# Patient Record
Sex: Female | Born: 1990 | Hispanic: No | Marital: Married | Smoking: Never smoker
Health system: Southern US, Community
[De-identification: ages and names within clinical notes are randomized; demographics above are authoritative.]

## PROBLEM LIST (undated history)

## (undated) ENCOUNTER — Inpatient Hospital Stay (HOSPITAL_COMMUNITY): Payer: Self-pay

## (undated) DIAGNOSIS — E119 Type 2 diabetes mellitus without complications: Secondary | ICD-10-CM

## (undated) HISTORY — PX: NO PAST SURGERIES: SHX2092

---

## 2014-01-28 ENCOUNTER — Encounter: Payer: Self-pay | Admitting: Nurse Practitioner

## 2014-03-25 ENCOUNTER — Encounter: Payer: Self-pay | Admitting: Nurse Practitioner

## 2014-03-25 ENCOUNTER — Ambulatory Visit (INDEPENDENT_AMBULATORY_CARE_PROVIDER_SITE_OTHER): Payer: Self-pay | Admitting: Nurse Practitioner

## 2014-03-25 VITALS — BP 113/79 | HR 96 | Temp 97.9°F | Ht 65.0 in | Wt 227.7 lb

## 2014-03-25 DIAGNOSIS — E669 Obesity, unspecified: Secondary | ICD-10-CM

## 2014-03-25 DIAGNOSIS — Z01419 Encounter for gynecological examination (general) (routine) without abnormal findings: Secondary | ICD-10-CM

## 2014-03-25 DIAGNOSIS — Z309 Encounter for contraceptive management, unspecified: Secondary | ICD-10-CM | POA: Insufficient documentation

## 2014-03-25 DIAGNOSIS — N926 Irregular menstruation, unspecified: Secondary | ICD-10-CM | POA: Insufficient documentation

## 2014-03-25 DIAGNOSIS — Z0000 Encounter for general adult medical examination without abnormal findings: Secondary | ICD-10-CM

## 2014-03-25 DIAGNOSIS — Z113 Encounter for screening for infections with a predominantly sexual mode of transmission: Secondary | ICD-10-CM

## 2014-03-25 MED ORDER — NORGESTIMATE-ETH ESTRADIOL 0.25-35 MG-MCG PO TABS: 1.0000 | ORAL_TABLET | Freq: Every day | ORAL | Status: DC

## 2014-03-25 NOTE — Progress Notes (Signed)
Pt. Here today for irregular cycles. States she has her period 2-3 times per month. Also c/o of mild pelvic pain.

## 2014-03-25 NOTE — Patient Instructions (Signed)
Contraception Choices Contraception (birth control) is the use of any methods or devices to prevent pregnancy. Below are some methods to help avoid pregnancy. HORMONAL METHODS   Contraceptive implant This is a thin, plastic tube containing progesterone hormone. It does not contain estrogen hormone. Your health care provider inserts the tube in the inner part of the upper arm. The tube can remain in place for up to 3 years. After 3 years, the implant must be removed. The implant prevents the ovaries from releasing an egg (ovulation), thickens the cervical mucus to prevent sperm from entering the uterus, and thins the lining of the inside of the uterus.  Progesterone-only injections These injections are given every 3 months by your health care provider to prevent pregnancy. This synthetic progesterone hormone stops the ovaries from releasing eggs. It also thickens cervical mucus and changes the uterine lining. This makes it harder for sperm to survive in the uterus.  Birth control pills These pills contain estrogen and progesterone hormone. They work by preventing the ovaries from releasing eggs (ovulation). They also cause the cervical mucus to thicken, preventing the sperm from entering the uterus. Birth control pills are prescribed by a health care provider.Birth control pills can also be used to treat heavy periods.  Minipill This type of birth control pill contains only the progesterone hormone. They are taken every day of each month and must be prescribed by your health care provider.  Birth control patch The patch contains hormones similar to those in birth control pills. It must be changed once a week and is prescribed by a health care provider.  Vaginal ring The ring contains hormones similar to those in birth control pills. It is left in the vagina for 3 weeks, removed for 1 week, and then a new one is put back in place. The patient must be comfortable inserting and removing the ring from the  vagina.A health care provider's prescription is necessary.  Emergency contraception Emergency contraceptives prevent pregnancy after unprotected sexual intercourse. This pill can be taken right after sex or up to 5 days after unprotected sex. It is most effective the sooner you take the pills after having sexual intercourse. Most emergency contraceptive pills are available without a prescription. Check with your pharmacist. Do not use emergency contraception as your only form of birth control. BARRIER METHODS   Female condom This is a thin sheath (latex or rubber) that is worn over the penis during sexual intercourse. It can be used with spermicide to increase effectiveness.  Female condom. This is a soft, loose-fitting sheath that is put into the vagina before sexual intercourse.  Diaphragm This is a soft, latex, dome-shaped barrier that must be fitted by a health care provider. It is inserted into the vagina, along with a spermicidal jelly. It is inserted before intercourse. The diaphragm should be left in the vagina for 6 to 8 hours after intercourse.  Cervical cap This is a round, soft, latex or plastic cup that fits over the cervix and must be fitted by a health care provider. The cap can be left in place for up to 48 hours after intercourse.  Sponge This is a soft, circular piece of polyurethane foam. The sponge has spermicide in it. It is inserted into the vagina after wetting it and before sexual intercourse.  Spermicides These are chemicals that kill or block sperm from entering the cervix and uterus. They come in the form of creams, jellies, suppositories, foam, or tablets. They do not require a   prescription. They are inserted into the vagina with an applicator before having sexual intercourse. The process must be repeated every time you have sexual intercourse. INTRAUTERINE CONTRACEPTION  Intrauterine device (IUD) This is a T-shaped device that is put in a woman's uterus during a  menstrual period to prevent pregnancy. There are 2 types:  Copper IUD This type of IUD is wrapped in copper wire and is placed inside the uterus. Copper makes the uterus and fallopian tubes produce a fluid that kills sperm. It can stay in place for 10 years.  Hormone IUD This type of IUD contains the hormone progestin (synthetic progesterone). The hormone thickens the cervical mucus and prevents sperm from entering the uterus, and it also thins the uterine lining to prevent implantation of a fertilized egg. The hormone can weaken or kill the sperm that get into the uterus. It can stay in place for 3 5 years, depending on which type of IUD is used. PERMANENT METHODS OF CONTRACEPTION  Female tubal ligation This is when the woman's fallopian tubes are surgically sealed, tied, or blocked to prevent the egg from traveling to the uterus.  Hysteroscopic sterilization This involves placing a small coil or insert into each fallopian tube. Your doctor uses a technique called hysteroscopy to do the procedure. The device causes scar tissue to form. This results in permanent blockage of the fallopian tubes, so the sperm cannot fertilize the egg. It takes about 3 months after the procedure for the tubes to become blocked. You must use another form of birth control for these 3 months.  Female sterilization This is when the female has the tubes that carry sperm tied off (vasectomy).This blocks sperm from entering the vagina during sexual intercourse. After the procedure, the man can still ejaculate fluid (semen). NATURAL PLANNING METHODS  Natural family planning This is not having sexual intercourse or using a barrier method (condom, diaphragm, cervical cap) on days the woman could become pregnant.  Calendar method This is keeping track of the length of each menstrual cycle and identifying when you are fertile.  Ovulation method This is avoiding sexual intercourse during ovulation.  Symptothermal method This is  avoiding sexual intercourse during ovulation, using a thermometer and ovulation symptoms.  Post ovulation method This is timing sexual intercourse after you have ovulated. Regardless of which type or method of contraception you choose, it is important that you use condoms to protect against the transmission of sexually transmitted infections (STIs). Talk with your health care provider about which form of contraception is most appropriate for you. Document Released: 11/15/2005 Document Revised: 07/18/2013 Document Reviewed: 05/10/2013 [employer] Patient Information 2014 [employer].  

## 2014-03-25 NOTE — Progress Notes (Signed)
History:  Kayla FontanaSayeda Smob is a 23 y.o. G1P1001 who [facility], irregular cycles and STD screening. She is currently sexually active with one partner and having 2 periods per month. She is not using any birth control and is agreeable to starting BCP's. Her LMP was 5 days ago.   The following portions of the patient's history were reviewed and updated as appropriate: allergies, current medications, past family history, past medical history, past social history, past surgical history and problem list.  Review of Systems:  Pertinent items are noted in HPI.  Objective:  Physical Exam BP 113/79  Pulse 96  Temp(Src) 97.9 F (36.6 C) (Oral)  Wt 227 lb 11.2 oz (103.284 kg)  LMP 03/18/2014 GENERAL: Well-developed, well-nourished female in no acute distress.  HEENT: Normocephalic, atraumatic.  NECK: Supple. Normal thyroid.  LUNGS: Normal rate. Clear to auscultation bilaterally.  HEART: Regular rate and rhythm with no adventitious sounds.  BREASTS: Symmetric in size. No masses, skin changes, nipple drainage, or lymphadenopathy. ABDOMEN: Soft, nontender, nondistended. No organomegaly. Normal bowel sounds appreciated in all quadrants.  PELVIC: Normal external female genitalia. Vagina is pink and rugated.  Normal discharge. Normal cervix contour. Pap smear obtained. Uterus is normal in size. No adnexal mass or tenderness.  EXTREMITIES: No cyanosis, clubbing, or edema, 2+ distal pulses.   Labs and Imaging No results found.  Assessment & Plan:  Assessment:  Well woman exam Irregular Menstrual cycles   Plans:  Pt will start Orthocyclen one po qday # 28/11 refills She will follow up in 2 months for reevaluation She will be notified of any abnormal labs  Delbert PhenixLinda M Buffey Zabinski, NP 03/25/2014 5:35 PM

## 2014-03-26 LAB — GC/CHLAMYDIA PROBE AMP
CT PROBE, AMP APTIMA: NEGATIVE
GC Probe RNA: NEGATIVE

## 2014-05-24 ENCOUNTER — Ambulatory Visit: Payer: Self-pay | Admitting: Nurse Practitioner

## 2014-05-24 ENCOUNTER — Telehealth: Payer: Self-pay | Admitting: *Deleted

## 2014-05-24 ENCOUNTER — Encounter: Payer: Self-pay | Admitting: *Deleted

## 2014-05-24 NOTE — Telephone Encounter (Signed)
Kayla George missed a scheduled appointment today for follow up . Called her with [employer] and left a message we are calling to notify you that you missed a scheduled appt. - call back to rescheduled . Also sent letter.

## 2014-09-30 ENCOUNTER — Encounter: Payer: Self-pay | Admitting: Nurse Practitioner

## 2014-10-15 ENCOUNTER — Inpatient Hospital Stay (HOSPITAL_COMMUNITY)
Admission: AD | Admit: 2014-10-15 | Discharge: 2014-10-15 | Disposition: A | Discharge status: Home / Self Care | Payer: Self-pay | Source: Ambulatory Visit | Attending: Obstetrics & Gynecology | Admitting: Obstetrics & Gynecology

## 2014-10-15 ENCOUNTER — Encounter (HOSPITAL_COMMUNITY): Payer: Self-pay | Admitting: *Deleted

## 2014-10-15 ENCOUNTER — Inpatient Hospital Stay (HOSPITAL_COMMUNITY): Payer: Self-pay

## 2014-10-15 DIAGNOSIS — N8300 Follicular cyst of ovary, unspecified side: Secondary | ICD-10-CM

## 2014-10-15 DIAGNOSIS — G8929 Other chronic pain: Secondary | ICD-10-CM

## 2014-10-15 DIAGNOSIS — R1031 Right lower quadrant pain: Secondary | ICD-10-CM | POA: Insufficient documentation

## 2014-10-15 DIAGNOSIS — R1032 Left lower quadrant pain: Secondary | ICD-10-CM | POA: Insufficient documentation

## 2014-10-15 LAB — URINALYSIS, ROUTINE W REFLEX MICROSCOPIC
Bilirubin Urine: NEGATIVE
GLUCOSE, UA: NEGATIVE mg/dL
KETONES UR: 15 mg/dL — AB
Nitrite: NEGATIVE
PROTEIN: NEGATIVE mg/dL
Specific Gravity, Urine: 1.02 (ref 1.005–1.030)
UROBILINOGEN UA: 1 mg/dL (ref 0.0–1.0)
pH: 6.5 (ref 5.0–8.0)

## 2014-10-15 LAB — URINE MICROSCOPIC-ADD ON

## 2014-10-15 LAB — CBC
HCT: 41 % (ref 36.0–46.0)
HEMOGLOBIN: 13.7 g/dL (ref 12.0–15.0)
MCH: 29 pg (ref 26.0–34.0)
MCHC: 33.4 g/dL (ref 30.0–36.0)
MCV: 86.7 fL (ref 78.0–100.0)
Platelets: 309 10*3/uL (ref 150–400)
RBC: 4.73 MIL/uL (ref 3.87–5.11)
RDW: 13.5 % (ref 11.5–15.5)
WBC: 8.2 10*3/uL (ref 4.0–10.5)

## 2014-10-15 LAB — POCT PREGNANCY, URINE: PREG TEST UR: NEGATIVE

## 2014-10-15 LAB — HIV ANTIBODY (ROUTINE TESTING W REFLEX): HIV 1&2 Ab, 4th Generation: NONREACTIVE

## 2014-10-15 MED ORDER — KETOROLAC TROMETHAMINE 10 MG PO TABS
10.0000 mg | ORAL_TABLET | Freq: Once | ORAL | Status: AC
  Administered 2014-10-15: 10 mg via ORAL
  Filled 2014-10-15: qty 1

## 2014-10-15 MED ORDER — KETOROLAC TROMETHAMINE 60 MG/2ML IM SOLN: 60.0000 mg | Freq: Once | INTRAMUSCULAR | Status: DC

## 2014-10-15 MED ORDER — NORGESTIMATE-ETH ESTRADIOL 0.25-35 MG-MCG PO TABS: 1.0000 | ORAL_TABLET | Freq: Every day | ORAL | Status: DC

## 2014-10-15 MED ORDER — IBUPROFEN 600 MG PO TABS: 600.0000 mg | ORAL_TABLET | Freq: Four times a day (QID) | ORAL | Status: DC | PRN

## 2014-10-15 NOTE — Discharge Instructions (Signed)
You have an ovarian follicle (a normal, functional cyst of the ovary) on the left.   Abdominal Pain, Women Abdominal (stomach, pelvic, or belly) pain can be caused by many things. It is important to tell your doctor:  The location of the pain.  Does it come and go or is it present all the time?  Are there things that start the pain (eating certain foods, exercise)?  Are there other symptoms associated with the pain (fever, nausea, vomiting, diarrhea)? All of this is helpful to know when trying to find the cause of the pain. CAUSES   Stomach: virus or bacteria infection, or ulcer.  Intestine: appendicitis (inflamed appendix), regional ileitis (Crohn's disease), ulcerative colitis (inflamed colon), irritable bowel syndrome, diverticulitis (inflamed diverticulum of the colon), or cancer of the stomach or intestine.  Gallbladder disease or stones in the gallbladder.  Kidney disease, kidney stones, or infection.  Pancreas infection or cancer.  Fibromyalgia (pain disorder).  Diseases of the female organs:  Uterus: fibroid (non-cancerous) tumors or infection.  Fallopian tubes: infection or tubal pregnancy.  Ovary: cysts or tumors.  Pelvic adhesions (scar tissue).  Endometriosis (uterus lining tissue growing in the pelvis and on the pelvic organs).  Pelvic congestion syndrome (female organs filling up with blood just before the menstrual period).  Pain with the menstrual period.  Pain with ovulation (producing an egg).  Pain with an IUD (intrauterine device, birth control) in the uterus.  Cancer of the female organs.  Functional pain (pain not caused by a disease, may improve without treatment).  Psychological pain.  Depression. DIAGNOSIS  Your doctor will decide the seriousness of your pain by doing an examination.  Blood tests.  X-rays.  Ultrasound.  CT scan (computed tomography, special type of X-ray).  MRI (magnetic resonance imaging).  Cultures, for  infection.  Barium enema (dye inserted in the large intestine, to better view it with X-rays).  Colonoscopy (looking in intestine with a lighted tube).  Laparoscopy (minor surgery, looking in abdomen with a lighted tube).  Major abdominal exploratory surgery (looking in abdomen with a large incision). TREATMENT  The treatment will depend on the cause of the pain.   Many cases can be observed and treated at home.  Over-the-counter medicines recommended by your caregiver.  Prescription medicine.  Antibiotics, for infection.  Birth control pills, for painful periods or for ovulation pain.  Hormone treatment, for endometriosis.  Nerve blocking injections.  Physical therapy.  Antidepressants.  Counseling with a psychologist or psychiatrist.  Minor or major surgery. HOME CARE INSTRUCTIONS   Do not take laxatives, unless directed by your caregiver.  Take over-the-counter pain medicine only if ordered by your caregiver. Do not take aspirin because it can cause an upset stomach or bleeding.  Try a clear liquid diet (broth or water) as ordered by your caregiver. Slowly move to a bland diet, as tolerated, if the pain is related to the stomach or intestine.  Have a thermometer and take your temperature several times a day, and record it.  Bed rest and sleep, if it helps the pain.  Avoid sexual intercourse, if it causes pain.  Avoid stressful situations.  Keep your follow-up appointments and tests, as your caregiver orders.  If the pain does not go away with medicine or surgery, you may try:  Acupuncture.  Relaxation exercises (yoga, meditation).  Group therapy.  Counseling. SEEK MEDICAL CARE IF:   You notice certain foods cause stomach pain.  Your home care treatment is not helping your pain.  You need stronger pain medicine.  You want your IUD removed.  You feel faint or lightheaded.  You develop nausea and vomiting.  You develop a rash.  You are  having side effects or an allergy to your medicine. SEEK IMMEDIATE MEDICAL CARE IF:   Your pain does not go away or gets worse.  You have a fever.  Your pain is felt only in portions of the abdomen. The right side could possibly be appendicitis. The left lower portion of the abdomen could be colitis or diverticulitis.  You are passing blood in your stools (bright red or black tarry stools, with or without vomiting).  You have blood in your urine.  You develop chills, with or without a fever.  You pass out. MAKE SURE YOU:   Understand these instructions.  Will watch your condition.  Will get help right away if you are not doing well or get worse. Document Released: 09/12/2007 Document Revised: 04/01/2014 Document Reviewed: 10/02/2009 [facility] Patient Information 2015 [employer] [place]. This information is not intended to replace advice given to you by your health care provider. Make sure you discuss any questions you have with your health care provider.  Ovarian Cyst An ovarian cyst is a fluid-filled sac that forms on an ovary. The ovaries are small organs that produce eggs in women. Various types of cysts can form on the ovaries. Most are not cancerous. Many do not cause problems, and they often go away on their own. Some may cause symptoms and require treatment. Common types of ovarian cysts include:  Functional cysts--These cysts may occur every month during the menstrual cycle. This is normal. The cysts usually go away with the next menstrual cycle if the woman does not get pregnant. Usually, there are no symptoms with a functional cyst.  Endometrioma cysts--These cysts form from the tissue that lines the uterus. They are also called "chocolate cysts" because they become filled with blood that turns brown. This type of cyst can cause pain in the lower abdomen during intercourse and with your menstrual period.  Cystadenoma cysts--This type develops from the cells on the outside of  the ovary. These cysts can get very big and cause lower abdomen pain and pain with intercourse. This type of cyst can twist on itself, cut off its blood supply, and cause severe pain. It can also easily rupture and cause a lot of pain.  Dermoid cysts--This type of cyst is sometimes found in both ovaries. These cysts may contain different kinds of body tissue, such as skin, teeth, hair, or cartilage. They usually do not cause symptoms unless they get very big.  Theca lutein cysts--These cysts occur when too much of a certain hormone (human chorionic gonadotropin) is produced and overstimulates the ovaries to produce an egg. This is most common after procedures used to assist with the conception of a baby (in vitro fertilization). CAUSES   Fertility drugs can cause a condition in which multiple large cysts are formed on the ovaries. This is called ovarian hyperstimulation syndrome.  A condition called polycystic ovary syndrome can cause hormonal imbalances that can lead to nonfunctional ovarian cysts. SIGNS AND SYMPTOMS  Many ovarian cysts do not cause symptoms. If symptoms are present, they may include:  Pelvic pain or pressure.  Pain in the lower abdomen.  Pain during sexual intercourse.  Increasing girth (swelling) of the abdomen.  Abnormal menstrual periods.  Increasing pain with menstrual periods.  Stopping having menstrual periods without being pregnant. DIAGNOSIS  These cysts are  commonly found during a routine or annual pelvic exam. Tests may be ordered to find out more about the cyst. These tests may include:  Ultrasound.  X-ray of the pelvis.  CT scan.  MRI.  Blood tests. TREATMENT  Many ovarian cysts go away on their own without treatment. Your health care provider may want to check your cyst regularly for 2-3 months to see if it changes. For women in menopause, it is particularly important to monitor a cyst closely because of the higher rate of ovarian cancer in  menopausal women. When treatment is needed, it may include any of the following:  A procedure to drain the cyst (aspiration). This may be done using a long needle and ultrasound. It can also be done through a laparoscopic procedure. This involves using a thin, lighted tube with a tiny camera on the end (laparoscope) inserted through a small incision.  Surgery to remove the whole cyst. This may be done using laparoscopic surgery or an open surgery involving a larger incision in the lower abdomen.  Hormone treatment or birth control pills. These methods are sometimes used to help dissolve a cyst. HOME CARE INSTRUCTIONS   Only take over-the-counter or prescription medicines as directed by your health care provider.  Follow up with your health care provider as directed.  Get regular pelvic exams and Pap tests. SEEK MEDICAL CARE IF:   Your periods are late, irregular, or painful, or they stop.  Your pelvic pain or abdominal pain does not go away.  Your abdomen becomes larger or swollen.  You have pressure on your bladder or trouble emptying your bladder completely.  You have pain during sexual intercourse.  You have feelings of fullness, pressure, or discomfort in your stomach.  You lose weight for no apparent reason.  You feel generally ill.  You become constipated.  You lose your appetite.  You develop acne.  You have an increase in body and facial hair.  You are gaining weight, without changing your exercise and eating habits.  You think you are pregnant. SEEK IMMEDIATE MEDICAL CARE IF:   You have increasing abdominal pain.  You feel sick to your stomach (nauseous), and you throw up (vomit).  You develop a fever that comes on suddenly.  You have abdominal pain during a bowel movement.  Your menstrual periods become heavier than usual. MAKE SURE YOU:  Understand these instructions.  Will watch your condition.  Will get help right away if you are not doing well  or get worse. Document Released: 11/15/2005 Document Revised: 11/20/2013 Document Reviewed: 07/23/2013 [facility] Patient Information 2015 [employer], [place]. This information is not intended to replace advice given to you by your health care provider. Make sure you discuss any questions you have with your health care provider.

## 2014-10-15 NOTE — MAU Provider Note (Signed)
Chief Complaint: Abdominal Pain  Chief Complaint: Abdominal Pain  First Provider Initiated Contact with Patient 10/15/14 1658    SUBJECTIVE HPI: Kayla George is a 23 y.o. 201P1001 female who presents with intermittent low abdominal pain x several months. Seen in [facility] outpatient clinic in April 2015 for irregular menstrual periods and low abdominal pain. Started on Sprintec which helped with both pain and cycle regulation, but ran out of birth control pills one month ago. Pain has worsened since then, 8/10 on pain scale today. His not tried anything for the pain.  Patient states she has been in a mutually monogamous relationship with her husband of 3 years. Tested for gonorrhea and Chlamydia 03/25/2014. Declines pelvic exam/repeat testing today.  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   History reviewed. No pertinent past medical history. OB History  Gravida Para Term Preterm AB SAB TAB Ectopic Multiple Living  1 1 1  0 0 0 0 0 0 1    # Outcome Date GA Lbr Len/2nd Weight Sex Delivery Anes PTL Lv  1 Term 11/10/11 [redacted]w[redacted]d   M Vag-Spont        History reviewed. No pertinent past surgical history. History   Social History  . Marital Status: Married    Spouse Name: N/A    Number of Children: N/A  . Years of Education: N/A   Occupational History  . Not on file.   Social History Main Topics  . Smoking status: Never Smoker   . Smokeless tobacco: Never Used  . Alcohol Use: No  . Drug Use: No  . Sexual Activity: Yes    Birth Control/ Protection: None   Other Topics Concern  . Not on file   Social History Narrative   No current facility-administered medications on file prior to encounter.   No current outpatient prescriptions on file prior to encounter.   Allergies  Allergen Reactions  . Eggs Or Egg-Derived Products Rash    ROS: Pertinent positive items in HPI. Neg for fever, chills, urinary complaints, flank pain, vaginal discharge, intermenstrual bleeding  OBJECTIVE Blood  pressure 120/83, pulse 95, temperature 98.4 F (36.9 C), temperature source Oral, resp. rate 16, height 5\' 7"  (1.702 m), weight 107.049 kg (236 lb), last menstrual period 10/06/2014. GENERAL: Well-developed, well-nourished, obese female in no acute distress.  HEENT: Normocephalic. Facial acne HEART: normal rate RESP: normal effort ABDOMEN: Soft, mild SP tenderness. Pos BS. Neg CVAT. EXTREMITIES: Nontender, no edema NEURO: Alert and oriented SPECULUM EXAM: Refused  LAB RESULTS Results for orders placed or performed during the hospital encounter of 10/15/14 (from the past 24 hour(s))  Urinalysis, Routine w reflex microscopic     Status: Abnormal   Collection Time: 10/15/14  4:00 PM  Result Value Ref Range   Color, Urine YELLOW YELLOW   APPearance HAZY (A) CLEAR   Specific Gravity, Urine 1.020 1.005 - 1.030   pH 6.5 5.0 - 8.0   Glucose, UA NEGATIVE NEGATIVE mg/dL   Hgb urine dipstick SMALL (A) NEGATIVE   Bilirubin Urine NEGATIVE NEGATIVE   Ketones, ur 15 (A) NEGATIVE mg/dL   Protein, ur NEGATIVE NEGATIVE mg/dL   Urobilinogen, UA 1.0 0.0 - 1.0 mg/dL   Nitrite NEGATIVE NEGATIVE   Leukocytes, UA TRACE (A) NEGATIVE  Urine microscopic-add on     Status: Abnormal   Collection Time: 10/15/14  4:00 PM  Result Value Ref Range   Squamous Epithelial / LPF MANY (A) RARE   WBC, UA 3-6 <3 WBC/hpf   RBC / HPF 3-6 <3 RBC/hpf  Bacteria, UA MANY (A) RARE  Pregnancy, urine POC     Status: None   Collection Time: 10/15/14  4:13 PM  Result Value Ref Range   Preg Test, Ur NEGATIVE NEGATIVE  CBC     Status: None   Collection Time: 10/15/14  4:53 PM  Result Value Ref Range   WBC 8.2 4.0 - 10.5 K/uL   RBC 4.73 3.87 - 5.11 MIL/uL   Hemoglobin 13.7 12.0 - 15.0 g/dL   HCT 16.141.0 09.636.0 - 04.546.0 %   MCV 86.7 78.0 - 100.0 fL   MCH 29.0 26.0 - 34.0 pg   MCHC 33.4 30.0 - 36.0 g/dL   RDW 40.913.5 81.111.5 - 91.415.5 %   Platelets 309 150 - 400 K/uL    IMAGING Koreas Transvaginal Non-ob  10/15/2014   CLINICAL DATA:   Chronic BILATERAL lower quadrant abdominal and pelvic pain off and on for 1 year, regular menses  EXAM: TRANSABDOMINAL AND TRANSVAGINAL ULTRASOUND OF PELVIS  TECHNIQUE: Both transabdominal and transvaginal ultrasound examinations of the pelvis were performed. Transabdominal technique was performed for global imaging of the pelvis including uterus, ovaries, adnexal regions, and pelvic cul-de-sac. It was necessary to proceed with endovaginal exam following the transabdominal exam to visualize the uterus, endometrium and ovaries.  COMPARISON:  None  FINDINGS: Uterus  Measurements: 7.5 x 4.2 x 4.4 cm. Normal morphology without mass.  Endometrium  Thickness: 6 mm thick, normal. No endometrial fluid or focal abnormality.  Right ovary  Measurements: 3.4 x 1.6 x 1.6 cm. Normal morphology without mass.  Left ovary  Measurements: 4.0 x 1.5 x 2.0 cm. Normal morphology without mass.  Other findings  No adnexal masses or free pelvic fluid.  IMPRESSION: Normal exam.   Electronically Signed   By: Ulyses SouthwardMark  Boles M.D.   On: 10/15/2014 18:17   [place] Pelvis Complete  10/15/2014   CLINICAL DATA:  Chronic BILATERAL lower quadrant abdominal and pelvic pain off and on for 1 year, regular menses  EXAM: TRANSABDOMINAL AND TRANSVAGINAL ULTRASOUND OF PELVIS  TECHNIQUE: Both transabdominal and transvaginal ultrasound examinations of the pelvis were performed. Transabdominal technique was performed for global imaging of the pelvis including uterus, ovaries, adnexal regions, and pelvic cul-de-sac. It was necessary to proceed with endovaginal exam following the transabdominal exam to visualize the uterus, endometrium and ovaries.  COMPARISON:  None  FINDINGS: Uterus  Measurements: 7.5 x 4.2 x 4.4 cm. Normal morphology without mass.  Endometrium  Thickness: 6 mm thick, normal. No endometrial fluid or focal abnormality.  Right ovary  Measurements: 3.4 x 1.6 x 1.6 cm. Normal morphology without mass.  Left ovary  Measurements: 4.0 x 1.5 x 2.0 cm.  Normal morphology without mass.  Other findings  No adnexal masses or free pelvic fluid.  IMPRESSION: Normal exam.   Electronically Signed   By: Ulyses SouthwardMark  Boles M.D.   On: 10/15/2014 18:17    MAU COURSE  ASSESSMENT 1. Follicular cyst of ovary   2. Abdominal pain, chronic, bilateral lower quadrant   Infertility  PLAN Discharge home in stable condition. Lengthy conversation w/ pt about normal US results, but presence of normal follicle. Also from menstrual Hx, infertility and exam, CNM suspects PCOS/anovulatory bleeding. Since OCPs helped in past, recommend restarting.  After appearing disinterested in discussion of abd pain, pt revealed that she is primarily concerned w/ three-year Hx of infertility. Has frequent intercourse. Has never been to specialist. Informed pt that ED is not the right place for Dx and management of infertility. Gave list  of providers.      Follow-up Information    Follow up with [facility].   Specialty:  Obstetrics and Gynecology   Why:  As needed if symptoms continue   Contact information:   [address] [place] [address] [phone]      Follow up with [facility] [place] [facility]ERN[facility] ADMISS[facility].   Why:  As needed in emergencies   Contact information:   [address] [id number] mc [place] [address] [phone]       Medication List    STOP taking these medications        norgestimate-ethinyl estradiol 0.25-35 MG-MCG tablet  Commonly known as:  ORTHO-CYCLEN,SPRINTEC,PREVIFEM      TAKE these medications        ibuprofen 600 MG tablet  Commonly known as:  ADVIL,MOTRIN  Take 1 tablet (600 mg total) by mouth every 6 (six) hours as needed.         [place], [place] 10/15/2014  6:33 PM

## 2014-10-15 NOTE — MAU Note (Signed)
Pain in lower abd, started a couple months ago, maybe a year. Pain comes and goes.

## 2014-10-16 LAB — URINE CULTURE: Special Requests: NORMAL

## 2015-03-12 ENCOUNTER — Ambulatory Visit: Payer: PRIVATE HEALTH INSURANCE | Attending: Internal Medicine | Admitting: Internal Medicine

## 2015-03-12 ENCOUNTER — Encounter: Payer: Self-pay | Admitting: Internal Medicine

## 2015-03-12 VITALS — BP 124/84 | HR 84 | Temp 98.0°F | Resp 16 | Ht 71.0 in | Wt 242.0 lb

## 2015-03-12 DIAGNOSIS — E669 Obesity, unspecified: Secondary | ICD-10-CM | POA: Insufficient documentation

## 2015-03-12 DIAGNOSIS — N979 Female infertility, unspecified: Secondary | ICD-10-CM | POA: Insufficient documentation

## 2015-03-12 DIAGNOSIS — E119 Type 2 diabetes mellitus without complications: Secondary | ICD-10-CM | POA: Insufficient documentation

## 2015-03-12 LAB — POCT GLYCOSYLATED HEMOGLOBIN (HGB A1C): HEMOGLOBIN A1C: 8.1

## 2015-03-12 LAB — GLUCOSE, POCT (MANUAL RESULT ENTRY): POC GLUCOSE: 148 mg/dL — AB (ref 70–99)

## 2015-03-12 MED ORDER — BLOOD GLUCOSE MONITOR KIT: PACK | Status: DC

## 2015-03-12 MED ORDER — GLUCOSE BLOOD VI STRP: ORAL_STRIP | Status: DC

## 2015-03-12 MED ORDER — GLUCOCOM LANCETS 28G MISC: Status: DC

## 2015-03-12 MED ORDER — METFORMIN HCL 500 MG PO TABS: 500.0000 mg | ORAL_TABLET | Freq: Two times a day (BID) | ORAL | Status: DC

## 2015-03-12 NOTE — Patient Instructions (Signed)
I have given you the name of [facility] phone number is [phone]. You may call them directly and give them your insurance info. Thanks

## 2015-03-12 NOTE — Progress Notes (Signed)
Pt is here to establish care. Pt is concerned that she has been trying to get pregnant and she wants to know why she is unable to get pregnant.

## 2015-03-12 NOTE — Progress Notes (Signed)
Patient ID: Kayla George, female   DOB: Jun 29, 1991, 24 y.o.   MRN: [id number]  HDQ:[id number]  [id number]:[id number]  DOB - 1991/09/29  CC:  Chief Complaint  Patient presents with  . Establish Care       HPI: Kayla George is a 24 y.o. female here today to establish medical care. She has no past medical history. She states that she currently has a 10 year old child and has been trying to get pregnant for the past two years. She denies any history of PCOS or menustral concerns. She notes normal periods that come around the same expected time each month. LMP was 02/16/15.  She reports "bad" cramps with menstrual cycles and some hirsutism. Patient reports that she was told that she had elevated blood sugar and would like to be tested for diabetes. She denies polyuria, polydipsia, blurred vision, neuropathy, headaches, or weight loss.    Allergies  Allergen Reactions  . Eggs Or Egg-Derived Products Rash   History reviewed. No pertinent past medical history. Current Outpatient Prescriptions on File Prior to Visit  Medication Sig Dispense Refill  . ibuprofen (ADVIL,MOTRIN) 600 MG tablet Take 1 tablet (600 mg total) by mouth every 6 (six) hours as needed. (Patient not taking: Reported on 03/12/2015) 30 tablet 1   No current facility-administered medications on file prior to visit.   History reviewed. No pertinent family history. History   Social History  . Marital Status: Married    Spouse Name: N/A  . Number of Children: N/A  . Years of Education: N/A   Occupational History  . Not on file.   Social History Main Topics  . Smoking status: Never Smoker   . Smokeless tobacco: Never Used  . Alcohol Use: No  . Drug Use: No  . Sexual Activity: Yes    Birth Control/ Protection: None   Other Topics Concern  . Not on file   Social History Narrative    Review of Systems: See HPI     Objective:   Filed Vitals:   03/12/15 1145  BP: 124/84  Pulse: 84  Temp: 98 F (36.7 C)  Resp: 16     Physical Exam  Constitutional: She is oriented to person, place, and time.  Cardiovascular: Normal rate, regular rhythm and normal heart sounds.   Pulmonary/Chest: Effort normal and breath sounds normal.  Abdominal: Soft. Bowel sounds are normal.  Neurological: She is alert and oriented to person, place, and time.  Skin: Skin is warm and dry.  Psychiatric: She has a normal mood and affect.     Lab Results  Component Value Date   WBC 8.2 10/15/2014   HGB 13.7 10/15/2014   HCT 41.0 10/15/2014   MCV 86.7 10/15/2014   PLT 309 10/15/2014   No results found for: CREATININE, BUN, NA, K, CL, CO2  No results found for: HGBA1C Lipid Panel  No results found for: CHOL, TRIG, HDL, CHOLHDL, VLDL, LDLCALC     Assessment and plan:   Kayla George was seen today for establish care.  Diagnoses and all orders for this visit:  Infertility, female Orders: -     Ambulatory referral to Gynecology---r/o PCOS. She is a new onset diabetic discovered today. She reports some hirsutism Patient has [employer], I have advised her to call [facility].   Type 2 diabetes mellitus without complication Orders: -     metFORMIN (GLUCOPHAGE) 500 MG tablet; Take 1 tablet (500 mg total) by mouth 2 (two) times daily with a meal. -  Amb Referral to Nutrition and Diabetic E -     blood glucose meter kit and supplies KIT; Check blood sugar TID & QHS. (FOR ICD-9 250.00, 250.01). -     glucose blood (CHOICE DM FORA G20 TEST STRIPS) test strip; Use as instructed -     GlucoCom Lancets MISC; Check blood sugar TID & QHS Patient is a new onset diabetic and was educated for at least 50% of the visit on diabetes and diet, routine maintenance, foot care, and exercise.  Patient will begin Metformin daily and explained common side effects. Stressed medication compliance and weight loss. Patient given materials for managing diabetes. Will follow up in three months with repeat  A1C.  Obesity Orders: -     POCT glucose (manual entry) -     POCT glycosylated hemoglobin (Hb A1C) Weight loss discussed at length and its complications to health.  Patient will loss 10 months by next visit in 3 months.  Diet and exercise discussed as well as calorie intake. Stressed weight loss relating to new diagnoses of DM and infertility.     Return in about 3 months (around 06/11/2015) for Diabetes Mellitus.    Chari Manning, NP-C [facility] [phone] 03/12/2015, 12:22 PM

## 2015-03-16 ENCOUNTER — Encounter: Payer: Self-pay | Admitting: Internal Medicine

## 2015-05-26 ENCOUNTER — Ambulatory Visit: Payer: PRIVATE HEALTH INSURANCE | Admitting: Internal Medicine

## 2015-08-14 ENCOUNTER — Ambulatory Visit (INDEPENDENT_AMBULATORY_CARE_PROVIDER_SITE_OTHER): Payer: PRIVATE HEALTH INSURANCE | Admitting: Medical

## 2015-08-14 ENCOUNTER — Encounter: Payer: Self-pay | Admitting: Medical

## 2015-08-14 VITALS — BP 121/84 | HR 97 | Temp 98.5°F | Wt 235.4 lb

## 2015-08-14 DIAGNOSIS — Z124 Encounter for screening for malignant neoplasm of cervix: Secondary | ICD-10-CM

## 2015-08-14 DIAGNOSIS — Z01419 Encounter for gynecological examination (general) (routine) without abnormal findings: Secondary | ICD-10-CM

## 2015-08-14 DIAGNOSIS — R102 Pelvic and perineal pain: Secondary | ICD-10-CM | POA: Diagnosis not present

## 2015-08-14 DIAGNOSIS — Z113 Encounter for screening for infections with a predominantly sexual mode of transmission: Secondary | ICD-10-CM | POA: Diagnosis not present

## 2015-08-14 DIAGNOSIS — Z1151 Encounter for screening for human papillomavirus (HPV): Secondary | ICD-10-CM | POA: Diagnosis not present

## 2015-08-14 NOTE — Patient Instructions (Signed)
Pelvic Pain Pelvic pain is pain felt below the belly button and between your hips. It can be caused by many different things. It is important to get help right away. This is especially true for severe, sharp, or unusual pain that comes on suddenly.  HOME CARE  Only take medicine as told by your doctor.  Rest as told by your doctor.  Eat a healthy diet, such as fruits, vegetables, and lean meats.  Drink enough fluids to keep your pee (urine) clear or pale yellow, or as told.  Avoid sex (intercourse) if it causes pain.  Apply warm or cold packs to your lower belly (abdomen). Use the type of pack that helps the pain.  Avoid situations that cause you stress.  Keep a journal to track your pain. Write down:  When the pain started.  Where it is located.  If there are things that seem to be related to the pain, such as food or your period.  Follow up with your doctor as told. GET HELP RIGHT AWAY IF:   You have heavy bleeding from the vagina.  You have more pelvic pain.  You feel lightheaded or pass out (faint).  You have chills.  You have pain when you pee or have blood in your pee.  You cannot stop having watery poop (diarrhea).  You cannot stop throwing up (vomiting).  You have a fever or lasting symptoms for more than 3 days.  You have a fever and your symptoms suddenly get worse.  You are being physically or sexually abused.  Your medicine does not help your pain.  You have fluid (discharge) coming from your vagina that is not normal. MAKE SURE YOU:  Understand these instructions.  Will watch your condition.  Will get help if you are not doing well or get worse. Document Released: 05/03/2008 Document Revised: 05/16/2012 Document Reviewed: 03/06/2012 ExitCare Patient Information 2015 [employer]. This information is not intended to replace advice given to you by your health care provider. Make sure you discuss any questions you have with your health care  provider.  

## 2015-08-14 NOTE — Progress Notes (Signed)
Patient ID: Kayla George, female   DOB: 1990-12-04, 24 y.o.   MRN: [id number] Subjective:    Kayla George is a 24 y.o. female who presents for an annual exam. The patient has no complaints today. She states that has a regular monthly cycle. She denies use of birth control. She desires pregnancy. The patient is sexually active. GYN screening history: last pap: approximate date 02/2014 and was normal. The patient wears seatbelts: yes. The patient participates in regular exercise: no. Has the patient ever been transfused or tattooed?: no. The patient reports that there is not domestic violence in her life.   Menstrual History: OB History    Gravida Para Term Preterm AB TAB SAB Ectopic Multiple Living   0 0 0 0 0 0 1      Menarche age: 24 years old  Patient's last menstrual period was 07/27/2015 (exact date).    The following portions of the patient's history were reviewed and updated as appropriate: allergies, current medications, past family history, past medical history, past social history, past surgical history and problem list.  Review of Systems Pertinent items are noted in HPI.    Objective:     BP 121/84 mmHg  Pulse 97  Temp(Src) 98.5 F (36.9 C)  Wt 235 lb 6.4 oz (106.777 kg)  LMP 07/27/2015 (Exact Date) CONSTITUTIONAL: Well-developed, well-nourished female in no acute distress.  HENT:  Normocephalic, atraumatic, External right and left ear normal. Oropharynx is clear and moist EYES: Conjunctivae and EOM are normal. Pupils are equal, round, and reactive to light. No scleral icterus.  NECK: Normal range of motion, supple, no masses.   SKIN: Skin is warm and dry. No rash noted. Not diaphoretic. No erythema. No pallor. NEUROLGIC: Alert and oriented to person, place, and time. Normal reflexes, muscle tone coordination. No cranial nerve deficit noted. PSYCHIATRIC: Normal mood and affect. Normal behavior. Normal judgment and thought content. CARDIOVASCULAR: Normal heart rate noted,  regular rhythm RESPIRATORY: Clear to auscultation bilaterally. Effort and breath sounds normal, no problems with respiration noted. BREASTS: Patient declines ABDOMEN: Soft, normal bowel sounds, no distention noted. Mild bilateral lower abdominal tenderness to palpation without rebound or guarding.  PELVIC: Normal appearing external genitalia; normal appearing vaginal mucosa and cervix.  Normal appearing discharge.  Pap smear obtained.  Normal uterine size, no other palpable masses, no uterine or adnexal tenderness. MUSCULOSKELETAL: Normal range of motion. No tenderness.  No cyanosis, clubbing, or edema.    .     Assessment:    Healthy female exam.   Pelvic pain in female, more likely GI related  Plan:     Pap smear obtained. patient will be called with abnormal results   GC/Chlamydia off the pap smear Pelvic and transvaginal US ordered to evaluated pelvic pain Patient to return to [facility] in 1 year for annual exam or sooner if [place] warrants further work-up or as needed  Marny Lowenstein, PA-C 08/14/2015 3:07 PM

## 2015-08-18 LAB — CYTOLOGY - PAP

## 2015-08-22 ENCOUNTER — Ambulatory Visit (HOSPITAL_COMMUNITY)
Admission: RE | Admit: 2015-08-22 | Discharge: 2015-08-22 | Disposition: A | Discharge status: Home / Self Care | Payer: PRIVATE HEALTH INSURANCE | Source: Ambulatory Visit | Attending: Medical | Admitting: Medical

## 2015-08-22 DIAGNOSIS — R102 Pelvic and perineal pain: Secondary | ICD-10-CM | POA: Diagnosis present

## 2015-08-26 ENCOUNTER — Telehealth: Payer: Self-pay | Admitting: General Practice

## 2015-08-26 NOTE — Telephone Encounter (Signed)
Per Raynelle Fanning, patient's [place] and lab tests are normal and do NOT indicate a GYN reason for her abdominal pain. She will need to follow-up with a PCP for further evaluation if the pain continues. Patient can come back in a year for annual exam. Called patient with [employer] [phone], no answer and unable to leave message

## 2015-09-01 ENCOUNTER — Encounter: Payer: Self-pay | Admitting: *Deleted

## 2015-09-01 NOTE — Telephone Encounter (Signed)
[employer] ID# [phone], Attempted to contact patient, no answer, unable to leave a message.  Will send a letter.  Letter sent.

## 2016-02-18 ENCOUNTER — Encounter: Payer: Self-pay | Admitting: *Deleted

## 2016-02-18 DIAGNOSIS — Z139 Encounter for screening, unspecified: Secondary | ICD-10-CM

## 2016-02-18 NOTE — Congregational Nurse Program (Signed)
Congregational Nurse Program Note  Date of Encounter: 02/18/2016  Past Medical History: No past medical history on file.  Encounter Details:     CNP Questionnaire - 02/17/16 1230    Patient Demographics   Is this a new or existing patient? New   Patient is considered a/an [place]   Race Other   Patient Assistance   Location of Patient Assistance [place]   Patient's financial/insurance status Medicaid   Uninsured Patient No   Patient referred to apply for the following financial assistance Not Applicable   Food insecurities addressed Not Applicable   Transportation assistance No   Assistance securing medications No   Educational health offerings Navigating the healthcare system   Encounter Details   Primary purpose of visit Navigating the Healthcare System   Was an Emergency Department visit averted? Not Applicable   Does patient have a medical provider? Yes   Patient referred to Area Agency   Was a mental health screening completed? (GAINS tool) No   Does patient have dental issues? No   Does patient have vision issues? No   Since previous encounter, have you referred patient for abnormal blood pressure that resulted in a new diagnosis or medication change? No   Since previous encounter, have you referred patient for abnormal blood glucose that resulted in a new diagnosis or medication change? No       Client came for [facility] to be [facility]. Appointment made for April 21 at 0930 .Marland Kitchen. Information was given with understanding of time of visit and what she needed to bring

## 2016-03-19 ENCOUNTER — Encounter: Payer: Self-pay | Admitting: Obstetrics & Gynecology

## 2016-04-09 ENCOUNTER — Ambulatory Visit: Payer: PRIVATE HEALTH INSURANCE | Admitting: Obstetrics & Gynecology

## 2016-04-09 ENCOUNTER — Encounter: Payer: Self-pay | Admitting: Obstetrics & Gynecology

## 2016-04-09 ENCOUNTER — Ambulatory Visit (INDEPENDENT_AMBULATORY_CARE_PROVIDER_SITE_OTHER): Payer: PRIVATE HEALTH INSURANCE | Admitting: Obstetrics & Gynecology

## 2016-04-09 DIAGNOSIS — N978 Female infertility of other origin: Secondary | ICD-10-CM

## 2016-04-09 DIAGNOSIS — Z711 Person with feared health complaint in whom no diagnosis is made: Secondary | ICD-10-CM

## 2016-04-09 NOTE — Progress Notes (Signed)
Patient ID: Kayla George, female   DOB: 11/09/1991, 25 y.o.   MRN: [id number] History:  25 y.o. G1P1001 here today for eval of inability to conceive. Pt with no other problems.  She reports reg monthly cycles.  Her last chil was born in 2012. Pt was on pills to 'reactivate the uterus.'  The following portions of the patient's history were reviewed and updated as appropriate: allergies, current medications, past family history, past medical history, past social history, past surgical history and problem list.  Review of Systems:  Pertinent items are noted in HPI.  Objective:  Physical Exam There were no vitals taken for this visit. Gen: NAD   Labs and Imaging No results found.  Assessment & Plan:  Secondary infertility.  Reviewed most likely times to conceive Given info for Dr. Stann OreYalcinkaya  Kayla George, M.D., FACOG   Of note: Pt has another chart which was created for this visit based on the name given by pt.  She was schedule today for a annual but,. Had an annual with normal PAP 07/2015  Kayla George, M.D., Evern [facility]

## 2016-04-09 NOTE — Progress Notes (Deleted)
Patient ID: Kayla George, female   DOB: 04/30/1991, 25 y.o.   MRN: [id number] Subjective:     Kayla George is a 25 y.o. female here for a routine exam.  G1P1. del via SVD 2012. Current complaints: unable to get pregnant.  Pt was on pills to 'reactivate the uterus'   Gynecologic History Patient's last menstrual period was 04/03/2016. Contraception: none Last Pap: ***. Results were: {norm/abn:16337} Last mammogram: ***. Results were: {norm/abn:16337}  Obstetric History OB History  No data available     {Common ambulatory SmartLinks:19316}  Review of Systems {ros; complete:30496}    Objective:    {exam; complete:18323}    Assessment:    Healthy female exam.    Plan:    {plan:19193}

## 2016-04-10 NOTE — Progress Notes (Signed)
Patient ID: Margaretha SheffieldSayeda Ifhag, female   DOB: 02/13/1991, 25 y.o.   MRN: [id number] Pt has another chart under another name

## 2016-04-14 ENCOUNTER — Encounter: Payer: Self-pay | Admitting: Obstetrics & Gynecology

## 2016-05-18 ENCOUNTER — Other Ambulatory Visit: Payer: Self-pay | Admitting: Specialist

## 2016-05-18 DIAGNOSIS — R102 Pelvic and perineal pain: Secondary | ICD-10-CM

## 2016-05-26 ENCOUNTER — Ambulatory Visit
Admission: RE | Admit: 2016-05-26 | Discharge: 2016-05-26 | Disposition: A | Discharge status: Home / Self Care | Payer: No Typology Code available for payment source | Source: Ambulatory Visit | Attending: Specialist | Admitting: Specialist

## 2016-05-26 DIAGNOSIS — R102 Pelvic and perineal pain: Secondary | ICD-10-CM

## 2016-06-30 IMAGING — US US TRANSVAGINAL NON-OB
1 series · 13 of 25 positions shown · non-contrast
Comparison: 08/22/2015.

CLINICAL DATA: Intermittent pelvic pain.

EXAM:
TRANSABDOMINAL AND TRANSVAGINAL ULTRASOUND OF PELVIS
TECHNIQUE: Both transabdominal and transvaginal ultrasound examinations of the
pelvis were performed. Transabdominal technique was performed for
global imaging of the pelvis including uterus, ovaries, adnexal
regions, and pelvic cul-de-sac. It was necessary to proceed with
endovaginal exam following the transabdominal exam to visualize the
uterus and ovaries.

[Series 1: us transvaginal non-ob · 0.26mm/px · 13 of 71 slices shown]
[im 1/71]
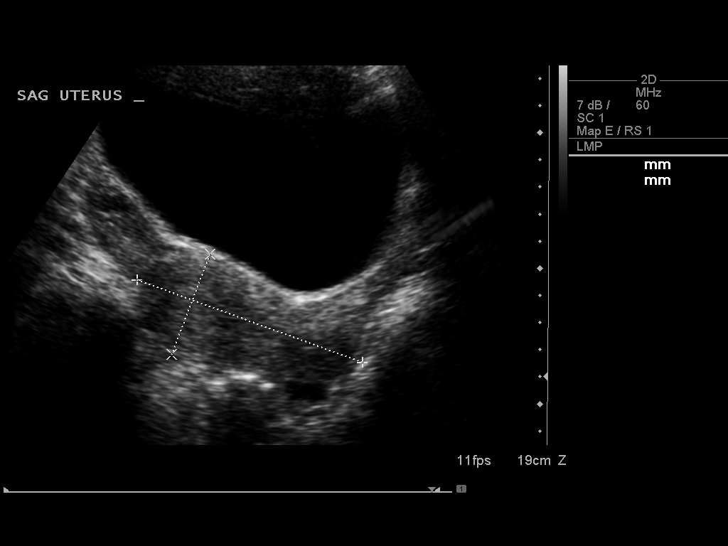
[im 6/71]
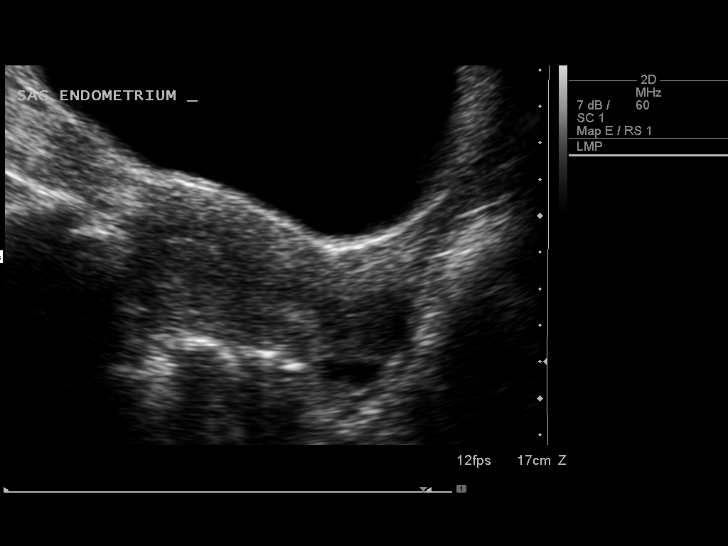
[im 12/71]
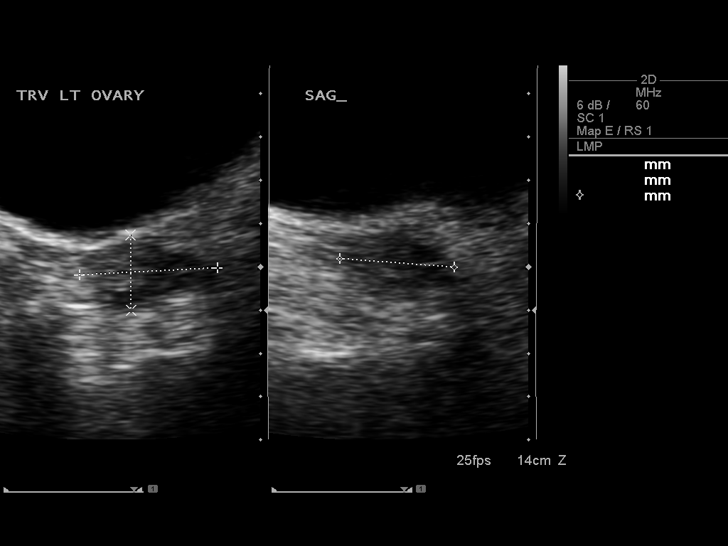
[im 18/71]
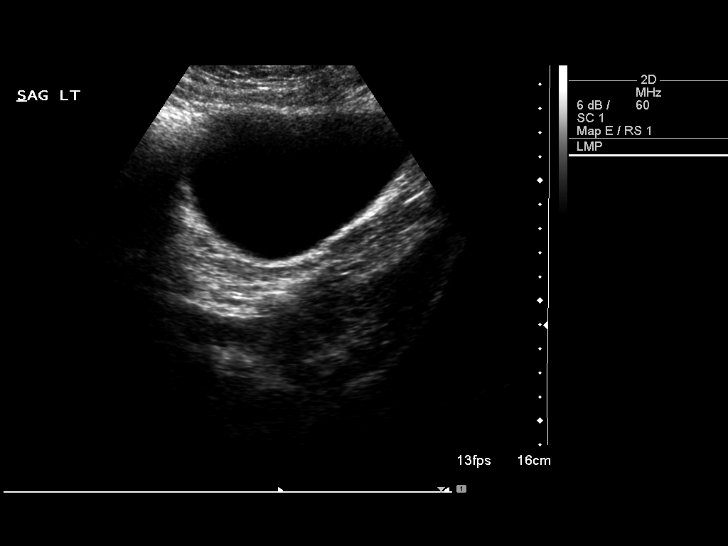
[im 24/71]
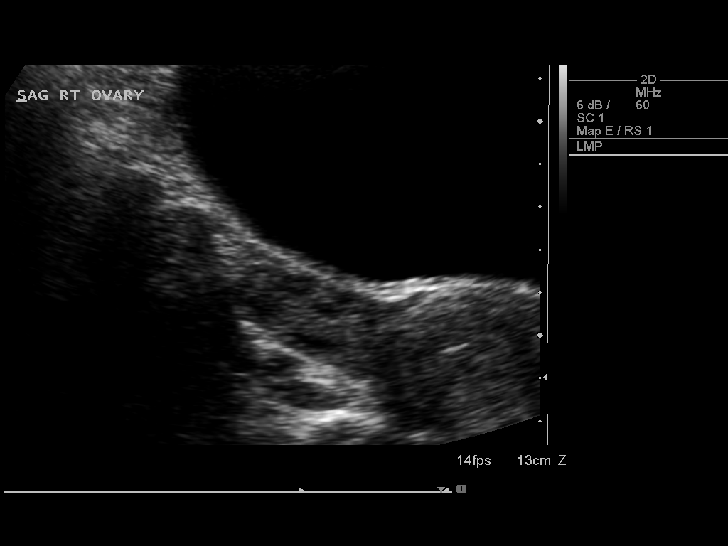
[im 30/71]
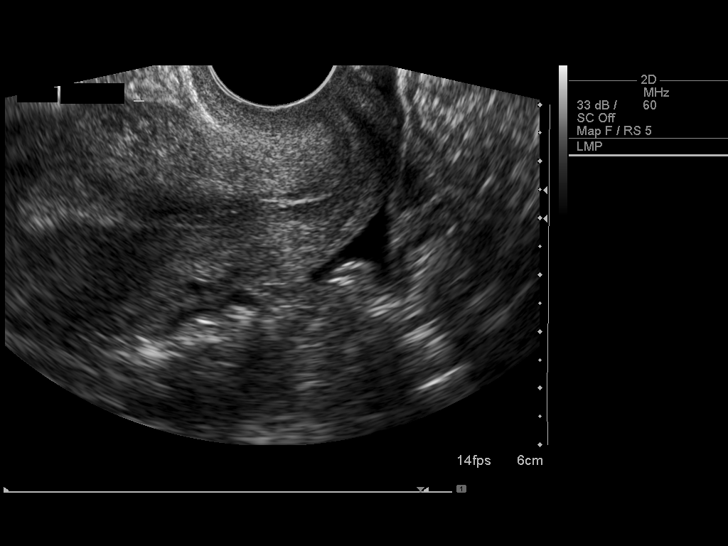
[im 36/71]
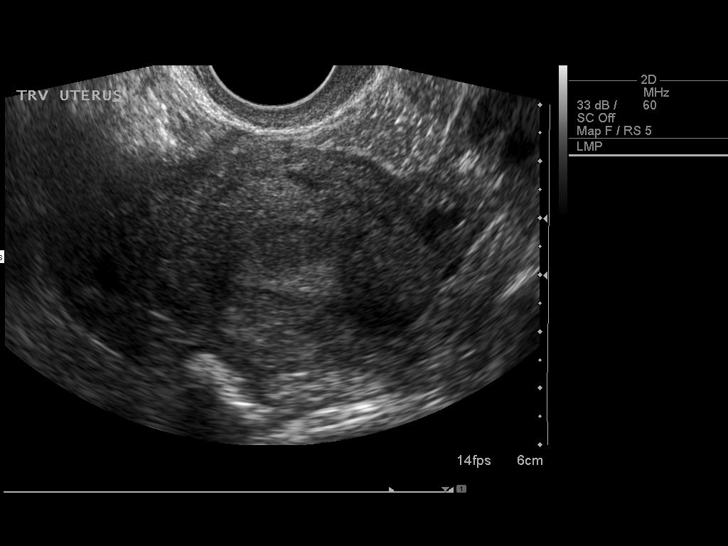
[im 41/71]
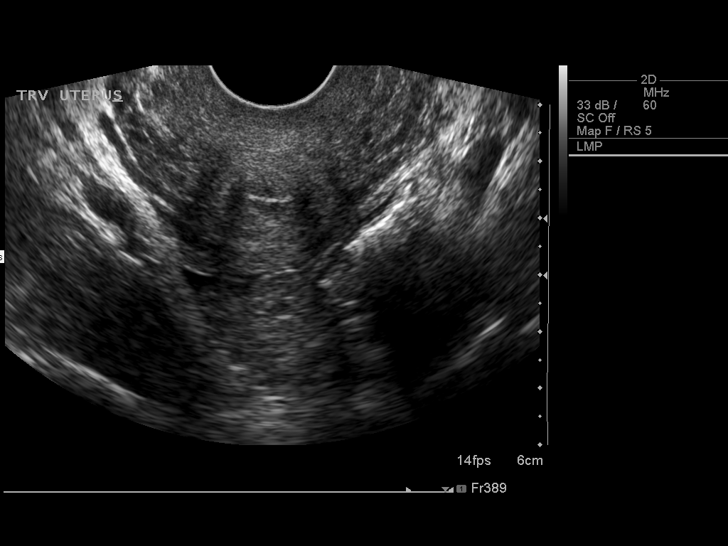
[im 47/71]
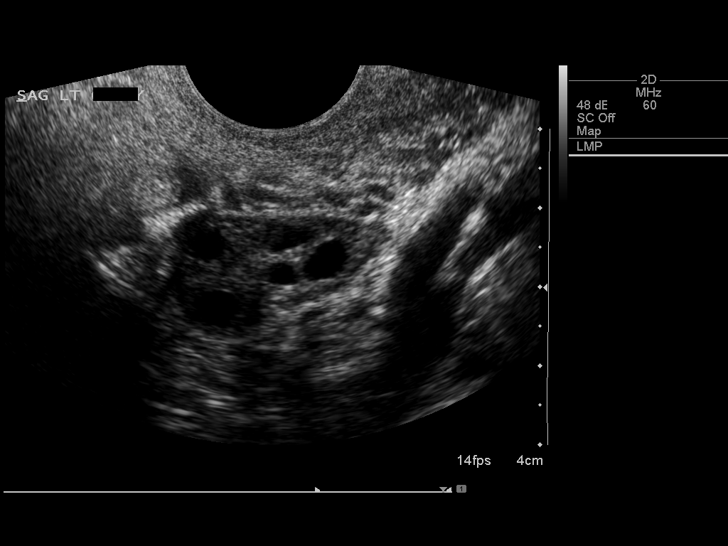
[im 53/71]
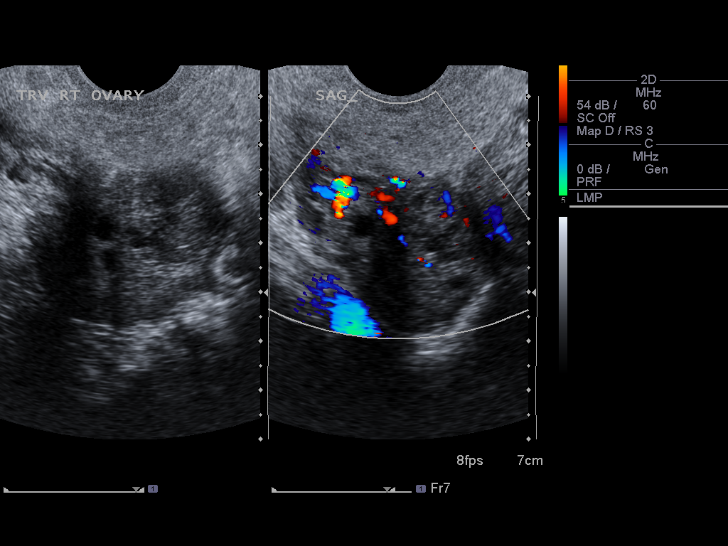
[im 59/71]
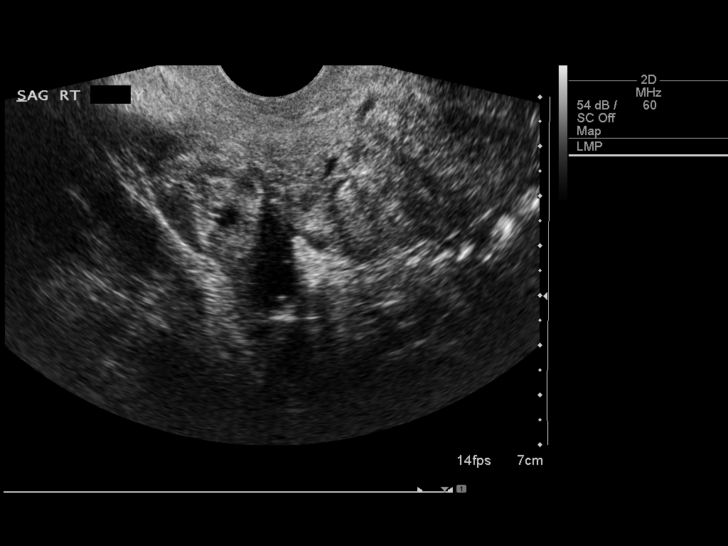
[im 65/71]
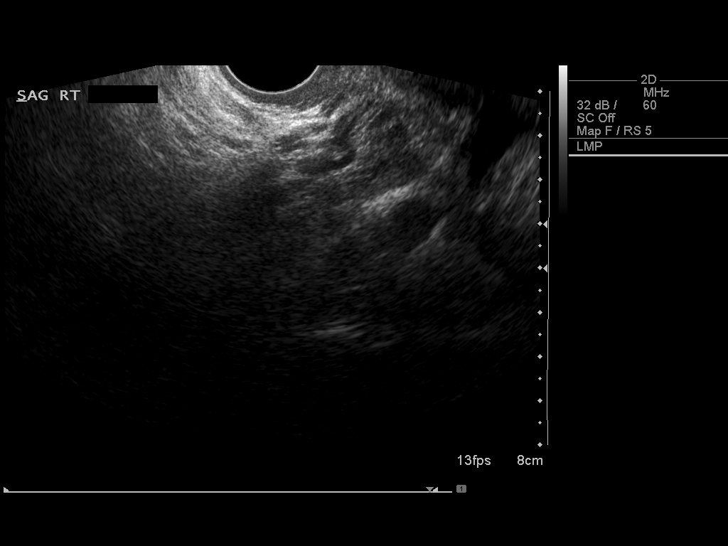
[im 71/71]
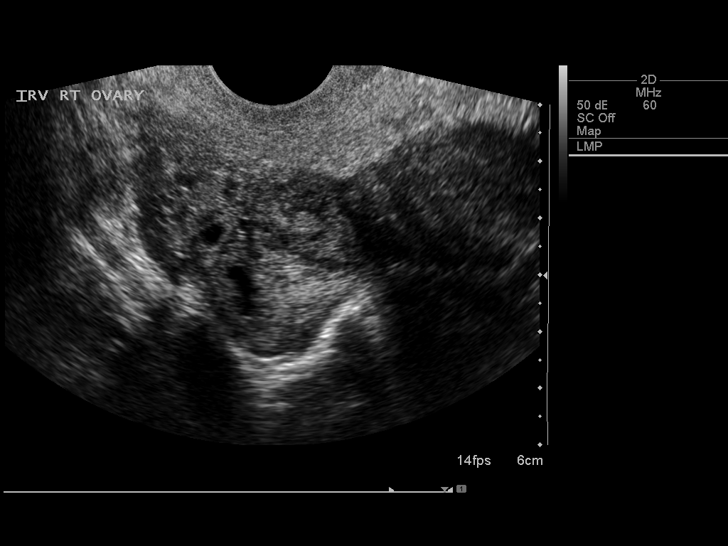

[13 of 25 positions shown; findings below may reference images not displayed]

FINDINGS: Uterus

Measurements: 8.0 x 4.3 x 5.6 cm. No focal abnormality identified.

Endometrium

Thickness: 10.5 mm.  No focal abnormality visualized.

Right ovary

Measurements: 4.4 x 3.2 x 4.0 cm. 1.8 x 1.6 x 1.5 cm inhomogeneous
area noted in the right ovary. A focal ovarian lesion cannot be
completely excluded. Follow-up pelvic ultrasound suggested to
demonstrate resolution. Pregnancy test is suggested to exclude
pregnancy/ectopic pregnancy.

Left ovary

Measurements: 4.3 x 1.5 x 2.2 cm. Normal appearance/no adnexal mass.

Other findings

Trace free pelvic fluid .
IMPRESSION: 1. 1.8 x 1.6 x 1.5 cm inhomogeneous area noted in the right ovary. A
focal ovarian lesion cannot be excluded. Follow-up pelvic ultrasound
suggested to demonstrate resolution. Pregnancy test is suggested to
exclude pregnancy/ectopic pregnancy. Short-interval follow up
ultrasound in 6-12 weeks is recommended, preferably during the week
following the patient's normal menses.

2.  Trace free pelvic fluid.

## 2016-09-30 ENCOUNTER — Encounter (HOSPITAL_COMMUNITY): Payer: Self-pay | Admitting: Emergency Medicine

## 2016-09-30 ENCOUNTER — Ambulatory Visit (HOSPITAL_COMMUNITY)
Admission: EM | Admit: 2016-09-30 | Discharge: 2016-09-30 | Disposition: A | Discharge status: Home / Self Care | Payer: No Typology Code available for payment source | Attending: Emergency Medicine | Admitting: Emergency Medicine

## 2016-09-30 DIAGNOSIS — A084 Viral intestinal infection, unspecified: Secondary | ICD-10-CM | POA: Diagnosis not present

## 2016-09-30 DIAGNOSIS — Z7984 Long term (current) use of oral hypoglycemic drugs: Secondary | ICD-10-CM | POA: Diagnosis not present

## 2016-09-30 DIAGNOSIS — N3000 Acute cystitis without hematuria: Secondary | ICD-10-CM

## 2016-09-30 LAB — POCT URINALYSIS DIP (DEVICE)
BILIRUBIN URINE: NEGATIVE
GLUCOSE, UA: NEGATIVE mg/dL
Ketones, ur: NEGATIVE mg/dL
Nitrite: NEGATIVE
Protein, ur: NEGATIVE mg/dL
SPECIFIC GRAVITY, URINE: 1.025 (ref 1.005–1.030)
Urobilinogen, UA: 0.2 mg/dL (ref 0.0–1.0)
pH: 5.5 (ref 5.0–8.0)

## 2016-09-30 LAB — POCT PREGNANCY, URINE: PREG TEST UR: NEGATIVE

## 2016-09-30 MED ORDER — CEPHALEXIN 500 MG PO CAPS: 500.0000 mg | ORAL_CAPSULE | Freq: Three times a day (TID) | ORAL | 0 refills | Status: DC

## 2016-09-30 NOTE — ED Triage Notes (Signed)
Prefers Arabic  Here for abd pain onset 3 days associated w/diarrhea and dysuria  Denies fevers  LMP = 10/25  A&O x4... NAD

## 2016-09-30 NOTE — Discharge Instructions (Signed)
You have a urinary tract infection. Take Keflex 3 times a day for 3 days.  The diarrhea is caused by a virus. Make sure you are drinking plenty of fluids. You can get Imodium at the drug store and take it once a day to slow down the diarrhea. The diarrhea will likely last another 1-2 weeks.  Follow-up as needed.

## 2016-09-30 NOTE — ED Provider Notes (Signed)
[place]    CSN: [id number] Arrival date & time: 09/30/16  1544     History   Chief Complaint Chief Complaint  Patient presents with  . Abdominal Pain    HPI Kayla George is a 25 y.o. female.   HPI  She is a 25 year old woman here for evaluation of diarrhea. An Arabic interpreter was used for this encounter. She reports a three-day history of fevers, abdominal pain, and diarrhea. She reports 10-15 watery bowel movements a day. She also reports some urinary discomfort. Her son was sick with similar symptoms earlier this week.  History reviewed. No pertinent past medical history.  Patient Active Problem List   Diagnosis Date Noted  . Obesity 03/25/2014  . Irregular menstrual cycle 03/25/2014  . Contraception management 03/25/2014  . Well woman exam 03/25/2014    History reviewed. No pertinent surgical history.  OB History    Gravida Para Term Preterm AB Living   '1 1 1 '$ 0 0     SAB TAB Ectopic Multiple Live Births   0 0 0           Home Medications    Prior to Admission medications   Medication Sig Start Date End Date Taking? Authorizing Provider  blood glucose meter kit and supplies KIT Check blood sugar TID & QHS. (FOR ICD-9 250.00, 250.01). 03/12/15   Lance Bosch, NP  cephALEXin (KEFLEX) 500 MG capsule Take 1 capsule (500 mg total) by mouth 3 (three) times daily. 09/30/16   Melony Overly, MD  GlucoCom Lancets MISC Check blood sugar TID & QHS 03/12/15   Lance Bosch, NP  glucose blood (CHOICE DM FORA G20 TEST STRIPS) test strip Use as instructed 03/12/15   Lance Bosch, NP  ibuprofen (ADVIL,MOTRIN) 600 MG tablet Take 1 tablet (600 mg total) by mouth every 6 (six) hours as needed. Patient not taking: Reported on 03/12/2015 10/15/14   Manya Silvas, CNM  metFORMIN (GLUCOPHAGE) 500 MG tablet Take 1 tablet (500 mg total) by mouth 2 (two) times daily with a meal. Patient not taking: Reported on 08/14/2015 03/12/15   Lance Bosch, NP    Family  History No family history on file.  Social History Social History  Substance Use Topics  . Smoking status: Never Smoker  . Smokeless tobacco: Never Used  . Alcohol use No     Allergies   Eggs or egg-derived products   Review of Systems Review of Systems As in history of present illness  Physical Exam Triage Vital Signs ED Triage Vitals  Enc Vitals Group     BP 09/30/16 1637 122/83     Pulse Rate 09/30/16 1637 92     Resp 09/30/16 1637 16     Temp 09/30/16 1637 98.6 F (37 C)     Temp Source 09/30/16 1637 Oral     SpO2 09/30/16 1637 100 %     Weight --      Height --      Head Circumference --      Peak Flow --      Pain Score 09/30/16 1640 8     Pain Loc --      Pain Edu? --      Excl. in [place]? --    No data found.   Updated Vital Signs BP 122/83 (BP Location: Left Arm)   Pulse 92   Temp 98.6 F (37 C) (Oral)   Resp 16   LMP 09/22/2016   SpO2  100%   Visual Acuity Right Eye Distance:   Left Eye Distance:   Bilateral Distance:    Right Eye Near:   Left Eye Near:    Bilateral Near:     Physical Exam  Constitutional: She is oriented to person, place, and time. She appears well-developed and well-nourished. No distress.  Cardiovascular: Normal rate, regular rhythm and normal heart sounds.   No murmur heard. Pulmonary/Chest: Effort normal and breath sounds normal. She has no wheezes.  Abdominal: Soft. She exhibits no distension. There is tenderness (suprapubic). There is no rebound and no guarding.  Neurological: She is alert and oriented to person, place, and time.     UC Treatments / Results  Labs (all labs ordered are listed, but only abnormal results are displayed) Labs Reviewed  POCT URINALYSIS DIP (DEVICE) - Abnormal; Notable for the following:       Result Value   Hgb urine dipstick MODERATE (*)    Leukocytes, UA TRACE (*)    All other components within normal limits  URINE CULTURE  POCT PREGNANCY, URINE    EKG  EKG  Interpretation None       Radiology No results found.  Procedures Procedures (including critical care time)  Medications Ordered in UC Medications - No data to display   Initial Impression / Assessment and Plan / UC Course  I have reviewed the triage vital signs and the nursing notes.  Pertinent labs & imaging results that were available during my care of the patient were reviewed by me and considered in my medical decision making (see chart for details).  Clinical Course    Keflex for UTI. Symptomatic treatment for viral gastroenteritis. Okay to use a little bit of OTC Imodium as needed. Follow-up as needed.  Final Clinical Impressions(s) / UC Diagnoses   Final diagnoses:  Viral gastroenteritis  Acute cystitis without hematuria    New Prescriptions New Prescriptions   CEPHALEXIN (KEFLEX) 500 MG CAPSULE    Take 1 capsule (500 mg total) by mouth 3 (three) times daily.     Melony Overly, MD 09/30/16 1740

## 2016-10-02 LAB — URINE CULTURE

## 2016-10-04 ENCOUNTER — Telehealth (HOSPITAL_COMMUNITY): Payer: Self-pay | Admitting: Emergency Medicine

## 2016-10-04 NOTE — Telephone Encounter (Signed)
Called [phone] but told by female that I had the wrong number.  Need to give lab results and to see how pt is doing from recent visit on 09/30/16

## 2016-10-04 NOTE — Telephone Encounter (Signed)
-----   Message from Charm RingsErin J Honig, MD sent at 10/02/2016  9:00 PM EDT ----- Urine culture negative for infection.  [facility]  Follow up as needed. EH

## 2016-11-29 NOTE — L&D Delivery Note (Addendum)
Delivery Note:Dr. Emelda FearFerguson, Dr. Parke SimmersBland, Thressa ShellerHeather Hogan CNM *delivery performed with stratus interpretor for patient communication At 5:14 PM a viable female was delivered via Vaginal, Spontaneous (Presentation: direct OA ).  APGAR: 7, 9; weight pending .   Placenta status: intact 3VC:  with the following complications: shoulder distocia/ single nuchal chord.  Cord pH: n/a  Baby presented direct OA with a single nuchal chord loose around the neck but unable to be reduced over head so baby was delivered through cord.  Baby rotated to left shoulder anterior and had a shoulder distocia which was reduced in ~20 seconds by freeing the left shoulder with axillary traction and releasing the left arm completely by sweepingthe L elbow posteriorly and freeing the hand..  The R  arm was then also delivered posteriorly and the delivery was completed in usual fashion.  Baby was immediately given skin to skin contact briefly and the cord was clamped and cut by medical staff so baby could be taken to warmer and assessed.  IM pitocin was given due to failure of IV during delivery.  Placenta was delivered intact.  There was a 2nd degree perineal laceration that was repaired using local lidocaine anesthetic and a single 2.0 monocryl suture cpontinuous running 2-layer closure, by JVferguson and was hemostatic after repair  Anesthesia: none Episiotomy: None Lacerations: 2nd degree Suture Repair: 2.0 monocryl Est. Blood Loss (mL):  400  Mom to postpartum.  Baby to Couplet care / Skin to Skin.  Marthenia RollingScott Bland 11/09/2017, 5:43 PM I was gloved and actively involved in delivery and surgical repair. Shoulder dystocia was mild, moderate pressure  Required for release. No traction on vertex.. Note above edited above for detail clarity. Tilda BurrowJohn V Keir Viernes, MD

## 2017-03-31 ENCOUNTER — Ambulatory Visit: Payer: Self-pay | Admitting: Family Medicine

## 2017-04-18 ENCOUNTER — Telehealth: Payer: Self-pay

## 2017-04-21 ENCOUNTER — Encounter (HOSPITAL_COMMUNITY): Payer: Self-pay | Admitting: *Deleted

## 2017-04-21 ENCOUNTER — Inpatient Hospital Stay (HOSPITAL_COMMUNITY)
Admission: AD | Admit: 2017-04-21 | Discharge: 2017-04-22 | Disposition: A | Discharge status: Home / Self Care | Payer: No Typology Code available for payment source | Source: Ambulatory Visit | Attending: Obstetrics & Gynecology | Admitting: Obstetrics & Gynecology

## 2017-04-21 ENCOUNTER — Inpatient Hospital Stay (HOSPITAL_COMMUNITY): Payer: Self-pay

## 2017-04-21 DIAGNOSIS — Z3A01 Less than 8 weeks gestation of pregnancy: Secondary | ICD-10-CM | POA: Insufficient documentation

## 2017-04-21 DIAGNOSIS — Z3490 Encounter for supervision of normal pregnancy, unspecified, unspecified trimester: Secondary | ICD-10-CM

## 2017-04-21 DIAGNOSIS — O26891 Other specified pregnancy related conditions, first trimester: Secondary | ICD-10-CM

## 2017-04-21 DIAGNOSIS — R102 Pelvic and perineal pain: Secondary | ICD-10-CM | POA: Insufficient documentation

## 2017-04-21 LAB — URINALYSIS, ROUTINE W REFLEX MICROSCOPIC
BILIRUBIN URINE: NEGATIVE
GLUCOSE, UA: 50 mg/dL — AB
KETONES UR: NEGATIVE mg/dL
LEUKOCYTES UA: NEGATIVE
NITRITE: NEGATIVE
PH: 5 (ref 5.0–8.0)
PROTEIN: NEGATIVE mg/dL
Specific Gravity, Urine: 1.028 (ref 1.005–1.030)

## 2017-04-21 LAB — POCT PREGNANCY, URINE: PREG TEST UR: POSITIVE — AB

## 2017-04-21 LAB — WET PREP, GENITAL
CLUE CELLS WET PREP: NONE SEEN
SPERM: NONE SEEN
TRICH WET PREP: NONE SEEN
YEAST WET PREP: NONE SEEN

## 2017-04-21 LAB — CBC
HCT: 37.1 % (ref 36.0–46.0)
HEMOGLOBIN: 12.6 g/dL (ref 12.0–15.0)
MCH: 28.8 pg (ref 26.0–34.0)
MCHC: 34 g/dL (ref 30.0–36.0)
MCV: 84.9 fL (ref 78.0–100.0)
Platelets: 264 10*3/uL (ref 150–400)
RBC: 4.37 MIL/uL (ref 3.87–5.11)
RDW: 12.9 % (ref 11.5–15.5)
WBC: 7.8 10*3/uL (ref 4.0–10.5)

## 2017-04-21 LAB — HCG, QUANTITATIVE, PREGNANCY: hCG, Beta Chain, Quant, S: 38810 m[IU]/mL — ABNORMAL HIGH (ref <5)

## 2017-04-21 NOTE — MAU Note (Signed)
PT  SAYS SHE HAD  BROWN   D/C  WHEN  SHE WIPED  THIS  MORN   AND  NOW.    HAS LOWER ABD  CRAMPS -   7.

## 2017-04-21 NOTE — MAU Note (Signed)
Pt reports pain in lower abdomen under her naval and on the R side of her abdomen since 3:00 today and some spotting since 4:00. Denies recent intercourse.  Pt has had a headache for one month.

## 2017-04-21 NOTE — MAU Provider Note (Signed)
History     CSN: [id number]  Arrival date and time: 04/21/17 2155   First Provider Initiated Contact with Patient 04/21/17 2258      Chief Complaint  Patient presents with  . Pelvic Pain   Stratus Arabic interpreter used for all interactions.    Pelvic Pain  The patient's primary symptoms include pelvic pain and vaginal bleeding. This is a new problem. The current episode started today (around 1500). The problem occurs constantly. The problem has been unchanged. Pain severity now: 7/10. The problem affects both sides. She is pregnant. Pertinent negatives include no chills, dysuria, fever, frequency, nausea, urgency or vomiting. The vaginal discharge was bloody. The vaginal bleeding is spotting. She has not been passing clots. She has not been passing tissue. Nothing aggravates the symptoms. She has tried nothing for the symptoms. Her menstrual history has been regular (LMP: 02/22/17 ).    Past Medical History:  Diagnosis Date  . Medical history non-contributory     Past Surgical History:  Procedure Laterality Date  . NO PAST SURGERIES      No family history on file.  Social History  Substance Use Topics  . Smoking status: Never Smoker  . Smokeless tobacco: Never Used  . Alcohol use No    Allergies:  Allergies  Allergen Reactions  . Eggs Or Egg-Derived Products Rash    Prescriptions Prior to Admission  Medication Sig Dispense Refill Last Dose  . blood glucose meter kit and supplies KIT Check blood sugar TID & QHS. (FOR ICD-9 250.00, 250.01). 1 each 0 Taking  . cephALEXin (KEFLEX) 500 MG capsule Take 1 capsule (500 mg total) by mouth 3 (three) times daily. 9 capsule 0   . GlucoCom Lancets MISC Check blood sugar TID & QHS 100 each 0 Taking  . glucose blood (CHOICE DM FORA G20 TEST STRIPS) test strip Use as instructed 100 each 12 Taking  . ibuprofen (ADVIL,MOTRIN) 600 MG tablet Take 1 tablet (600 mg total) by mouth every 6 (six) hours as needed. (Patient not taking:  Reported on 03/12/2015) 30 tablet 1 Not Taking  . metFORMIN (GLUCOPHAGE) 500 MG tablet Take 1 tablet (500 mg total) by mouth 2 (two) times daily with a meal. (Patient not taking: Reported on 08/14/2015) 60 tablet 3 Not Taking    Review of Systems  Constitutional: Negative for chills and fever.  Gastrointestinal: Negative for nausea and vomiting.  Genitourinary: Positive for pelvic pain and vaginal bleeding. Negative for dysuria, frequency and urgency.   Physical Exam   Blood pressure 123/82, pulse 95, temperature 98.2 F (36.8 C), temperature source Oral, resp. rate 18, height 5' 9.5" (1.765 m), weight 225 lb 12 oz (102.4 kg), last menstrual period 02/22/2017.  Physical Exam  Nursing note and vitals reviewed. Constitutional: She is oriented to person, place, and time. She appears well-developed and well-nourished. No distress.  HENT:  Head: Normocephalic.  Cardiovascular: Normal rate.   Respiratory: Effort normal.  GI: Soft. There is no tenderness. There is no rebound.  Neurological: She is alert and oriented to person, place, and time.  Skin: Skin is warm and dry.  Psychiatric: She has a normal mood and affect.   Results for orders placed or performed during the hospital encounter of 04/21/17 (from the past 24 hour(s))  Urinalysis, Routine w reflex microscopic     Status: Abnormal   Collection Time: 04/21/17 10:07 PM  Result Value Ref Range   Color, Urine YELLOW YELLOW   APPearance CLEAR CLEAR   Specific Gravity,  Urine 1.028 1.005 - 1.030   pH 5.0 5.0 - 8.0   Glucose, UA 50 (A) NEGATIVE mg/dL   Hgb urine dipstick SMALL (A) NEGATIVE   Bilirubin Urine NEGATIVE NEGATIVE   Ketones, ur NEGATIVE NEGATIVE mg/dL   Protein, ur NEGATIVE NEGATIVE mg/dL   Nitrite NEGATIVE NEGATIVE   Leukocytes, UA NEGATIVE NEGATIVE   RBC / HPF 0-5 0 - 5 RBC/hpf   WBC, UA 0-5 0 - 5 WBC/hpf   Bacteria, UA RARE (A) NONE SEEN   Squamous Epithelial / LPF 0-5 (A) NONE SEEN   Mucous PRESENT   Pregnancy,  urine POC     Status: Abnormal   Collection Time: 04/21/17 10:51 PM  Result Value Ref Range   Preg Test, Ur POSITIVE (A) NEGATIVE  CBC     Status: None   Collection Time: 04/21/17 10:55 PM  Result Value Ref Range   WBC 7.8 4.0 - 10.5 K/uL   RBC 4.37 3.87 - 5.11 MIL/uL   Hemoglobin 12.6 12.0 - 15.0 g/dL   HCT 37.1 36.0 - 46.0 %   MCV 84.9 78.0 - 100.0 fL   MCH 28.8 26.0 - 34.0 pg   MCHC 34.0 30.0 - 36.0 g/dL   RDW 12.9 11.5 - 15.5 %   Platelets 264 150 - 400 K/uL  hCG, quantitative, pregnancy     Status: Abnormal   Collection Time: 04/21/17 10:55 PM  Result Value Ref Range   hCG, Beta Chain, Quant, S 38,810 (H) <5 mIU/mL  Wet prep, genital     Status: Abnormal   Collection Time: 04/21/17 11:17 PM  Result Value Ref Range   Yeast Wet Prep HPF POC NONE SEEN NONE SEEN   Trich, Wet Prep NONE SEEN NONE SEEN   Clue Cells Wet Prep HPF POC NONE SEEN NONE SEEN   WBC, Wet Prep HPF POC MODERATE (A) NONE SEEN   Sperm NONE SEEN    US Ob Comp Less 14 Wks  Result Date: 04/22/2017 CLINICAL DATA:  Spotting and cramping. Estimated gestational age by LMP is 8 weeks 2 days. Quantitative beta HCG is pending. EXAM: OBSTETRIC <14 WK [place] AND TRANSVAGINAL OB US TECHNIQUE: Both transabdominal and transvaginal ultrasound examinations were performed for complete evaluation of the gestation as well as the maternal uterus, adnexal regions, and pelvic cul-de-sac. Transvaginal technique was performed to assess early pregnancy. COMPARISON:  None. FINDINGS: Intrauterine gestational sac: A single intrauterine pregnancy is identified. Gestational sac appears regular. Yolk sac:  Yolk sac is present. Embryo:  Fetal pole is identified. Cardiac Activity: Fetal cardiac activity is observed. Heart Rate: 166  bpm CRL:  15.7  mm   7 w   6 d                  [place] EDC: 12/02/2016 Subchorionic hemorrhage:  None visualized. Maternal uterus/adnexae: Uterus is anteverted. No myometrial mass lesions identified. Both ovaries are visualized  and appear normal. Corpus luteum cyst on the left. No abnormal adnexal masses. No free fluid in the pelvis. IMPRESSION: Single intrauterine pregnancy. Estimated gestational age by crown-rump length is 7 weeks 6 days. No acute complication demonstrated by ultrasound criteria. Electronically Signed   By: Lucienne Capers M.D.   On: 04/22/2017 00:14   US Ob Transvaginal  Result Date: 04/22/2017 CLINICAL DATA:  Spotting and cramping. Estimated gestational age by LMP is 8 weeks 2 days. Quantitative beta HCG is pending. EXAM: OBSTETRIC <14 WK [place] AND TRANSVAGINAL OB US TECHNIQUE: Both transabdominal and transvaginal ultrasound examinations  were performed for complete evaluation of the gestation as well as the maternal uterus, adnexal regions, and pelvic cul-de-sac. Transvaginal technique was performed to assess early pregnancy. COMPARISON:  None. FINDINGS: Intrauterine gestational sac: A single intrauterine pregnancy is identified. Gestational sac appears regular. Yolk sac:  Yolk sac is present. Embryo:  Fetal pole is identified. Cardiac Activity: Fetal cardiac activity is observed. Heart Rate: 166  bpm CRL:  15.7  mm   7 w   6 d                  [place] EDC: 12/02/2016 Subchorionic hemorrhage:  None visualized. Maternal uterus/adnexae: Uterus is anteverted. No myometrial mass lesions identified. Both ovaries are visualized and appear normal. Corpus luteum cyst on the left. No abnormal adnexal masses. No free fluid in the pelvis. IMPRESSION: Single intrauterine pregnancy. Estimated gestational age by crown-rump length is 7 weeks 6 days. No acute complication demonstrated by ultrasound criteria. Electronically Signed   By: Lucienne Capers M.D.   On: 04/22/2017 00:14   MAU Course  Procedures  MDM   Assessment and Plan   1. Intrauterine pregnancy   2. Pelvic pain in pregnancy, antepartum, first trimester   3. [redacted] weeks gestation of pregnancy    DC home Comfort measures reviewed  1st Trimester precautions  RX:  none  Return to MAU as needed FU with OB as planned  Follow-up Information    Department, [facility] Follow up.   Contact information: [place] [address] [phone]           Marcille Buffy 04/21/2017, 11:10 PM

## 2017-04-22 ENCOUNTER — Encounter: Payer: Self-pay | Admitting: Family Medicine

## 2017-04-22 ENCOUNTER — Encounter (HOSPITAL_COMMUNITY): Payer: Self-pay | Admitting: *Deleted

## 2017-04-22 ENCOUNTER — Ambulatory Visit (INDEPENDENT_AMBULATORY_CARE_PROVIDER_SITE_OTHER): Payer: No Typology Code available for payment source | Admitting: Family Medicine

## 2017-04-22 VITALS — BP 130/82 | HR 72 | Temp 98.4°F | Resp 14 | Ht 69.5 in | Wt 224.0 lb

## 2017-04-22 DIAGNOSIS — E119 Type 2 diabetes mellitus without complications: Secondary | ICD-10-CM | POA: Diagnosis not present

## 2017-04-22 DIAGNOSIS — O9989 Other specified diseases and conditions complicating pregnancy, childbirth and the puerperium: Secondary | ICD-10-CM | POA: Diagnosis not present

## 2017-04-22 DIAGNOSIS — Z3A08 8 weeks gestation of pregnancy: Secondary | ICD-10-CM | POA: Diagnosis not present

## 2017-04-22 LAB — POCT URINALYSIS DIP (DEVICE)
BILIRUBIN URINE: NEGATIVE
Glucose, UA: 100 mg/dL — AB
HGB URINE DIPSTICK: NEGATIVE
KETONES UR: NEGATIVE mg/dL
LEUKOCYTES UA: NEGATIVE
NITRITE: NEGATIVE
PH: 5.5 (ref 5.0–8.0)
Protein, ur: NEGATIVE mg/dL
Specific Gravity, Urine: 1.025 (ref 1.005–1.030)
Urobilinogen, UA: 0.2 mg/dL (ref 0.0–1.0)

## 2017-04-22 LAB — POCT URINE PREGNANCY

## 2017-04-22 LAB — POCT GLYCOSYLATED HEMOGLOBIN (HGB A1C): HEMOGLOBIN A1C: 9.6

## 2017-04-22 LAB — ABO/RH: ABO/RH(D): B NEG

## 2017-04-22 LAB — GLUCOSE, CAPILLARY: Glucose-Capillary: 190 mg/dL — ABNORMAL HIGH (ref 65–99)

## 2017-04-22 MED ORDER — FOLIC ACID 1 MG PO TABS: 1.0000 mg | ORAL_TABLET | Freq: Every day | ORAL | 3 refills | Status: DC

## 2017-04-22 MED ORDER — PRENATAL VITAMINS 0.8 MG PO TABS: 1.0000 | ORAL_TABLET | Freq: Every day | ORAL | 3 refills | Status: DC

## 2017-04-22 NOTE — Progress Notes (Signed)
Patient ID: Kayla George, female    DOB: Apr 11, 1991, 26 y.o.   MRN: [id number]  PCP: Scot Jun, FNP  Chief Complaint  Patient presents with  . Establish Care    Subjective:  HPI- Interpreter  [phone] Interpreter service and patient reports some difficulty due to the dialect of aerobic being spoken.  Kayla George is a 26 y.o. female presents to establish care. Medical problems include diabetes and recent diagnosis of pregnancy. Patient's last menstrual period was 02/22/2017. She was seen and evaluated at [facility] on yesterday. Serum quantitative HCG confirmed pregnancy with an average  gestational weeks of 6-8 weeks. US OB Complete confirmed viable pregnancy with an estimated gestational age  [redacted] weeks and 6 days. Patient reports that she has not taken any medication for diabetes for  several years. Last hemoglobin A1C 8.1 03/12/2015. Reports one prior  routine pregnancy with a normal vaginal delivery.Denies any abdominal pain or pain with urination. She had experienced some vaginal spotting and cramping according to the referral notes for  early obstetric ultrasound performed at [facility] on yesterday.  Unable to ascertain specifics regarding yesterday's visit due to language barrier and discharge summary is not completed.  Social History   Social History  . Marital status: Married    Spouse name: N/A  . Number of children: N/A  . Years of education: N/A   Occupational History  . Not on file.   Social History Main Topics  . Smoking status: Never Smoker  . Smokeless tobacco: Never Used  . Alcohol use No  . Drug use: No  . Sexual activity: Yes    Birth control/ protection: None   Other Topics Concern  . Not on file   Social History Narrative   ** Merged History Encounter **       History reviewed. No pertinent family history. Review of Systems See HPI Patient Active Problem List   Diagnosis Date Noted  . Obesity 03/25/2014  . Irregular  menstrual cycle 03/25/2014  . Contraception management 03/25/2014  . Well woman exam 03/25/2014    Allergies  Allergen Reactions  . Eggs Or Egg-Derived Products Rash    Prior to Admission medications   Medication Sig Start Date End Date Taking? Authorizing Provider  blood glucose meter kit and supplies KIT Check blood sugar TID & QHS. (FOR ICD-9 250.00, 250.01). 03/12/15   Lance Bosch, NP  cephALEXin (KEFLEX) 500 MG capsule Take 1 capsule (500 mg total) by mouth 3 (three) times daily. Patient not taking: Reported on 04/22/2017 09/30/16   Melony Overly, MD  GlucoCom Lancets MISC Check blood sugar TID & QHS 03/12/15   Lance Bosch, NP  glucose blood (CHOICE DM FORA G20 TEST STRIPS) test strip Use as instructed 03/12/15   Lance Bosch, NP  metFORMIN (GLUCOPHAGE) 500 MG tablet Take 1 tablet (500 mg total) by mouth 2 (two) times daily with a meal. Patient not taking: Reported on 08/14/2015 03/12/15   Lance Bosch, NP    Past Medical, Surgical Family and Social History reviewed and updated.    Objective:-   Today's Vitals   04/22/17 1017  BP: 130/82  Pulse: 72  Resp: 14  Temp: 98.4 F (36.9 C)  TempSrc: Oral  SpO2: 100%  Weight: 224 lb (101.6 kg)  Height: 5' 9.5" (1.765 m)    Wt Readings from Last 3 Encounters:  04/22/17 224 lb (101.6 kg)  04/21/17 225 lb 12 oz (102.4 kg)  04/09/16 232 lb  3.2 oz (105.3 kg)   Physical Exam  Constitutional: She is oriented to person, place, and time. She appears well-developed and well-nourished.  HENT:  Head: Normocephalic and atraumatic.  Right Ear: External ear normal.  Left Ear: External ear normal.  Nose: Nose normal.  Mouth/Throat: Oropharynx is clear and moist.  Eyes: Conjunctivae are normal. Pupils are equal, round, and reactive to light.  Neck: Normal range of motion. Neck supple. No thyromegaly present.  Cardiovascular: Normal rate, regular rhythm, normal heart sounds and intact distal pulses.   Pulmonary/Chest: Effort  normal and breath sounds normal.  Abdominal: Soft. Bowel sounds are normal. She exhibits no distension and no mass. There is no tenderness. There is no rebound and no guarding.  Musculoskeletal: Normal range of motion.  Lymphadenopathy:    She has no cervical adenopathy.  Neurological: She is alert and oriented to person, place, and time.  Skin: Skin is warm and dry.  Psychiatric: She has a normal mood and affect. Her behavior is normal. Judgment and thought content normal.    Assessment & Plan:  1. Type 2 diabetes mellitus without complication, without long-term current use of insulin (HCC) - POCT glycosylated hemoglobin (Hb A1C),  -Yourdiabetes is uncontrolled.  -Please resume taking your metformin 500 mg twice daily in order to control your diabetes.  2. [redacted] weeks gestation of pregnancy - CBC with Differential - Comprehensive metabolic panel - Ambulatory refeal to Obstetrics / Gynecology - Folic Acid 1 mg daily -Prenatal vitamins, take 1 tablet daily  RTC:1 months for diabetes follow-up  Carroll Sage. Kenton Kingfisher, MSN, FNP-C [facility]  [address] [place], [place] [address] [phone]

## 2017-04-22 NOTE — Patient Instructions (Signed)
You diabetes is uncontrolled. Please resume taking your metformin 500 mg twice daily in order to control your diabetes. You have been referred to an Obstetrics for care during your pregancy. There office will contact you to schedule an appointment.        .     500           .        .      .   I have sent over a prescription for your to take daily Folic Acid 1 mg and 1 tablet of prenatal vitamins daily. Medications are at your pharmacy.  If you do not hear from an obsterics office within 2 weeks, please call me here to follow-up.           1   1      .      Marland Kitchen.                .Marland Kitchen

## 2017-04-22 NOTE — Discharge Instructions (Signed)
First Trimester of Pregnancy The first trimester of pregnancy is from week 1 until the end of week 13 (months 1 through 3). A week after a sperm fertilizes an egg, the egg will implant on the wall of the uterus. This embryo will begin to develop into a baby. Genes from you and your partner will form the baby. The female genes will determine whether the baby will be a boy or a girl. At 6-8 weeks, the eyes and face will be formed, and the heartbeat can be seen on ultrasound. At the end of 12 weeks, all the baby's organs will be formed. Now that you are pregnant, you will want to do everything you can to have a healthy baby. Two of the most important things are to get good prenatal care and to follow your health care provider's instructions. Prenatal care is all the medical care you receive before the baby's birth. This care will help prevent, find, and treat any problems during the pregnancy and childbirth. Body changes during your first trimester Your body goes through many changes during pregnancy. The changes vary from woman to woman.  You may gain or lose a couple of pounds at first.  You may feel sick to your stomach (nauseous) and you may throw up (vomit). If the vomiting is uncontrollable, call your health care provider.  You may tire easily.  You may develop headaches that can be relieved by medicines. All medicines should be approved by your health care provider.  You may urinate more often. Painful urination may mean you have a bladder infection.  You may develop heartburn as a result of your pregnancy.  You may develop constipation because certain hormones are causing the muscles that push stool through your intestines to slow down.  You may develop hemorrhoids or swollen veins (varicose veins).  Your breasts may begin to grow larger and become tender. Your nipples may stick out more, and the tissue that surrounds them (areola) may become darker.  Your gums may bleed and may be  sensitive to brushing and flossing.  Dark spots or blotches (chloasma, mask of pregnancy) may develop on your face. This will likely fade after the baby is born.  Your menstrual periods will stop.  You may have a loss of appetite.  You may develop cravings for certain kinds of food.  You may have changes in your emotions from day to day, such as being excited to be pregnant or being concerned that something may go wrong with the pregnancy and baby.  You may have more vivid and strange dreams.  You may have changes in your hair. These can include thickening of your hair, rapid growth, and changes in texture. Some women also have hair loss during or after pregnancy, or hair that feels dry or thin. Your hair will most likely return to normal after your baby is born.  What to expect at prenatal visits During a routine prenatal visit:  You will be weighed to make sure you and the baby are growing normally.  Your blood pressure will be taken.  Your abdomen will be measured to track your baby's growth.  The fetal heartbeat will be listened to between weeks 10 and 14 of your pregnancy.  Test results from any previous visits will be discussed.  Your health care provider may ask you:  How you are feeling.  If you are feeling the baby move.  If you have had any abnormal symptoms, such as leaking fluid, bleeding, severe headaches,   or abdominal cramping.  If you are using any tobacco products, including cigarettes, chewing tobacco, and electronic cigarettes.  If you have any questions.  Other tests that may be performed during your first trimester include:  Blood tests to find your blood type and to check for the presence of any previous infections. The tests will also be used to check for low iron levels (anemia) and protein on red blood cells (Rh antibodies). Depending on your risk factors, or if you previously had diabetes during pregnancy, you may have tests to check for high blood  sugar that affects pregnant women (gestational diabetes).  Urine tests to check for infections, diabetes, or protein in the urine.  An ultrasound to confirm the proper growth and development of the baby.  Fetal screens for spinal cord problems (spina bifida) and Down syndrome.  HIV (human immunodeficiency virus) testing. Routine prenatal testing includes screening for HIV, unless you choose not to have this test.  You may need other tests to make sure you and the baby are doing well.  Follow these instructions at home: Medicines  Follow your health care provider's instructions regarding medicine use. Specific medicines may be either safe or unsafe to take during pregnancy.  Take a prenatal vitamin that contains at least 600 micrograms (mcg) of folic acid.  If you develop constipation, try taking a stool softener if your health care provider approves. Eating and drinking  Eat a balanced diet that includes fresh fruits and vegetables, whole grains, good sources of protein such as meat, eggs, or tofu, and low-fat dairy. Your health care provider will help you determine the amount of weight gain that is right for you.  Avoid raw meat and uncooked cheese. These carry germs that can cause birth defects in the baby.  Eating four or five small meals rather than three large meals a day may help relieve nausea and vomiting. If you start to feel nauseous, eating a few soda crackers can be helpful. Drinking liquids between meals, instead of during meals, also seems to help ease nausea and vomiting.  Limit foods that are high in fat and processed sugars, such as fried and sweet foods.  To prevent constipation: ? Eat foods that are high in fiber, such as fresh fruits and vegetables, whole grains, and beans. ? Drink enough fluid to keep your urine clear or pale yellow. Activity  Exercise only as directed by your health care provider. Most women can continue their usual exercise routine during  pregnancy. Try to exercise for 30 minutes at least 5 days a week. Exercising will help you: ? Control your weight. ? Stay in shape. ? Be prepared for labor and delivery.  Experiencing pain or cramping in the lower abdomen or lower back is a good sign that you should stop exercising. Check with your health care provider before continuing with normal exercises.  Try to avoid standing for long periods of time. Move your legs often if you must stand in one place for a long time.  Avoid heavy lifting.  Wear low-heeled shoes and practice good posture.  You may continue to have sex unless your health care provider tells you not to. Relieving pain and discomfort  Wear a good support bra to relieve breast tenderness.  Take warm sitz baths to soothe any pain or discomfort caused by hemorrhoids. Use hemorrhoid cream if your health care provider approves.  Rest with your legs elevated if you have leg cramps or low back pain.  If you develop   varicose veins in your legs, wear support hose. Elevate your feet for 15 minutes, 3-4 times a day. Limit salt in your diet. Prenatal care  Schedule your prenatal visits by the twelfth week of pregnancy. They are usually scheduled monthly at first, then more often in the last 2 months before delivery.  Write down your questions. Take them to your prenatal visits.  Keep all your prenatal visits as told by your health care provider. This is important. Safety  Wear your seat belt at all times when driving.  Make a list of emergency phone numbers, including numbers for family, friends, the hospital, and police and fire departments. General instructions  Ask your health care provider for a referral to a local prenatal education class. Begin classes no later than the beginning of month 6 of your pregnancy.  Ask for help if you have counseling or nutritional needs during pregnancy. Your health care provider can offer advice or refer you to specialists for help  with various needs.  Do not use hot tubs, steam rooms, or saunas.  Do not douche or use tampons or scented sanitary pads.  Do not cross your legs for long periods of time.  Avoid cat litter boxes and soil used by cats. These carry germs that can cause birth defects in the baby and possibly loss of the fetus by miscarriage or stillbirth.  Avoid all smoking, herbs, alcohol, and medicines not prescribed by your health care provider. Chemicals in these products affect the formation and growth of the baby.  Do not use any products that contain nicotine or tobacco, such as cigarettes and e-cigarettes. If you need help quitting, ask your health care provider. You may receive counseling support and other resources to help you quit.  Schedule a dentist appointment. At home, brush your teeth with a soft toothbrush and be gentle when you floss. Contact a health care provider if:  You have dizziness.  You have mild pelvic cramps, pelvic pressure, or nagging pain in the abdominal area.  You have persistent nausea, vomiting, or diarrhea.  You have a bad smelling vaginal discharge.  You have pain when you urinate.  You notice increased swelling in your face, hands, legs, or ankles.  You are exposed to fifth disease or chickenpox.  You are exposed to German measles (rubella) and have never had it. Get help right away if:  You have a fever.  You are leaking fluid from your vagina.  You have spotting or bleeding from your vagina.  You have severe abdominal cramping or pain.  You have rapid weight gain or loss.  You vomit blood or material that looks like coffee grounds.  You develop a severe headache.  You have shortness of breath.  You have any kind of trauma, such as from a fall or a car accident. Summary  The first trimester of pregnancy is from week 1 until the end of week 13 (months 1 through 3).  Your body goes through many changes during pregnancy. The changes vary from  woman to woman.  You will have routine prenatal visits. During those visits, your health care provider will examine you, discuss any test results you may have, and talk with you about how you are feeling. This information is not intended to replace advice given to you by your health care provider. Make sure you discuss any questions you have with your health care provider. Document Released: 11/09/2001 Document Revised: 10/27/2016 Document Reviewed: 10/27/2016 Elsevier Interactive Patient Education  2017 Elsevier   Inc.  

## 2017-04-26 LAB — GC/CHLAMYDIA PROBE AMP (~~LOC~~) NOT AT ARMC
Chlamydia: NEGATIVE
Neisseria Gonorrhea: NEGATIVE

## 2017-04-27 NOTE — Telephone Encounter (Signed)
Lab didn't receive any labs for patient

## 2017-04-27 NOTE — Telephone Encounter (Signed)
-----   Message from Bing NeighborsKimberly S Harris, FNP sent at 04/27/2017  1:52 AM EDT ----- Please follow-up on CBC and CMP that hasn't resulted  ----- Message ----- From: SYSTEM Sent: 04/27/2017  12:06 AM To: Bing NeighborsKimberly S Harris, FNP

## 2017-05-02 NOTE — Congregational Nurse Program (Signed)
Congregational Nurse Program Note  Date of Encounter: 04/18/2017  Past Medical History: Past Medical History:  Diagnosis Date  . Medical history non-contributory     Encounter Details:

## 2017-05-30 ENCOUNTER — Ambulatory Visit: Payer: Self-pay | Admitting: Family Medicine

## 2017-06-17 ENCOUNTER — Encounter (HOSPITAL_COMMUNITY): Payer: Self-pay | Admitting: Student

## 2017-06-17 ENCOUNTER — Inpatient Hospital Stay (HOSPITAL_COMMUNITY)
Admission: AD | Admit: 2017-06-17 | Discharge: 2017-06-17 | Disposition: A | Discharge status: Home / Self Care | Payer: No Typology Code available for payment source | Source: Ambulatory Visit | Attending: Obstetrics & Gynecology | Admitting: Obstetrics & Gynecology

## 2017-06-17 DIAGNOSIS — Z3A16 16 weeks gestation of pregnancy: Secondary | ICD-10-CM | POA: Insufficient documentation

## 2017-06-17 DIAGNOSIS — Z711 Person with feared health complaint in whom no diagnosis is made: Secondary | ICD-10-CM

## 2017-06-17 DIAGNOSIS — Z3492 Encounter for supervision of normal pregnancy, unspecified, second trimester: Secondary | ICD-10-CM | POA: Insufficient documentation

## 2017-06-17 HISTORY — DX: Type 2 diabetes mellitus without complications: E11.9

## 2017-06-17 NOTE — MAU Note (Signed)
Patient presents for ultrasound denies abdominal pain and no bleeding. Has not been seen by a doctor in 2 months concerned about baby.

## 2017-06-17 NOTE — Discharge Instructions (Signed)
Second Trimester of Pregnancy The second trimester is from week 13 through week 28, month 4 through 6. This is often the time in pregnancy that you feel your best. Often times, morning sickness has lessened or quit. You may have more energy, and you may get hungry more often. Your unborn baby (fetus) is growing rapidly. At the end of the sixth month, he or she is about 9 inches long and weighs about 1 pounds. You will likely feel the baby move (quickening) between 18 and 20 weeks of pregnancy. Follow these instructions at home:  Avoid all smoking, herbs, and alcohol. Avoid drugs not approved by your doctor.  Do not use any tobacco products, including cigarettes, chewing tobacco, and electronic cigarettes. If you need help quitting, ask your doctor. You may get counseling or other support to help you quit.  Only take medicine as told by your doctor. Some medicines are safe and some are not during pregnancy.  Exercise only as told by your doctor. Stop exercising if you start having cramps.  Eat regular, healthy meals.  Wear a good support bra if your breasts are tender.  Do not use hot tubs, steam rooms, or saunas.  Wear your seat belt when driving.  Avoid raw meat, uncooked cheese, and liter boxes and soil used by cats.  Take your prenatal vitamins.  Take 1500-2000 milligrams of calcium daily starting at the 20th week of pregnancy until you deliver your baby.  Try taking medicine that helps you poop (stool softener) as needed, and if your doctor approves. Eat more fiber by eating fresh fruit, vegetables, and whole grains. Drink enough fluids to keep your pee (urine) clear or pale yellow.  Take warm water baths (sitz baths) to soothe pain or discomfort caused by hemorrhoids. Use hemorrhoid cream if your doctor approves.  If you have puffy, bulging veins (varicose veins), wear support hose. Raise (elevate) your feet for 15 minutes, 3-4 times a day. Limit salt in your diet.  Avoid heavy  lifting, wear low heals, and sit up straight.  Rest with your legs raised if you have leg cramps or low back pain.  Visit your dentist if you have not gone during your pregnancy. Use a soft toothbrush to brush your teeth. Be gentle when you floss.  You can have sex (intercourse) unless your doctor tells you not to.  Go to your doctor visits. Get help if:  You feel dizzy.  You have mild cramps or pressure in your lower belly (abdomen).  You have a nagging pain in your belly area.  You continue to feel sick to your stomach (nauseous), throw up (vomit), or have watery poop (diarrhea).  You have bad smelling fluid coming from your vagina.  You have pain with peeing (urination). Get help right away if:  You have a fever.  You are leaking fluid from your vagina.  You have spotting or bleeding from your vagina.  You have severe belly cramping or pain.  You lose or gain weight rapidly.  You have trouble catching your breath and have chest pain.  You notice sudden or extreme puffiness (swelling) of your face, hands, ankles, feet, or legs.  You have not felt the baby move in over an hour.  You have severe headaches that do not go away with medicine.  You have vision changes. This information is not intended to replace advice given to you by your health care provider. Make sure you discuss any questions you have with your health care   provider. Document Released: 02/09/2010 Document Revised: 04/22/2016 Document Reviewed: 01/16/2013 Elsevier Interactive Patient Education  2017 Elsevier Inc.  

## 2017-06-17 NOTE — MAU Provider Note (Signed)
History     CSN: [id number]  Arrival date and time: 06/17/17 1023  First Provider Initiated Contact with Patient 06/17/17 1042      Chief Complaint  Patient presents with  . worried   HPI Kayla George is a 26 y.o. G2P1000 at 52w3dwho presents for check up. HPI obtained using video Arabic interpreter. Patient denies abdominal pain, vaginal bleeding, or LOF. States she is here because she hasn't been able to start prenatal care yet. Pt has history of uncontrolled diabetes & is currently not taking her medication. Denies any complaints today.   OB History    Gravida Para Term Preterm AB Living   '2 1 1 ' 0 0     SAB TAB Ectopic Multiple Live Births   0 0 0          Past Medical History:  Diagnosis Date  . Diabetes mellitus without complication ([facility]     Past Surgical History:  Procedure Laterality Date  . NO PAST SURGERIES      No family history on file.  Social History  Substance Use Topics  . Smoking status: Never Smoker  . Smokeless tobacco: Never Used  . Alcohol use No    Allergies:  Allergies  Allergen Reactions  . Eggs Or Egg-Derived Products Rash    Prescriptions Prior to Admission  Medication Sig Dispense Refill Last Dose  . blood glucose meter kit and supplies KIT Check blood sugar TID & QHS. (FOR ICD-9 250.00, 250.01). 1 each 0 Taking  . cephALEXin (KEFLEX) 500 MG capsule Take 1 capsule (500 mg total) by mouth 3 (three) times daily. (Patient not taking: Reported on 04/22/2017) 9 capsule 0 Not Taking  . folic acid (FOLVITE) 1 MG tablet Take 1 tablet (1 mg total) by mouth daily. 90 tablet 3   . GlucoCom Lancets MISC Check blood sugar TID & QHS 100 each 0 Taking  . glucose blood (CHOICE DM FORA G20 TEST STRIPS) test strip Use as instructed 100 each 12 Taking  . metFORMIN (GLUCOPHAGE) 500 MG tablet Take 1 tablet (500 mg total) by mouth 2 (two) times daily with a meal. (Patient not taking: Reported on 08/14/2015) 60 tablet 3 Not Taking  . Prenatal  Multivit-Min-Fe-FA (PRENATAL VITAMINS) 0.8 MG tablet Take 1 tablet by mouth daily. 90 tablet 3     Review of Systems  Constitutional: Negative.   Gastrointestinal: Negative.   Genitourinary: Negative.    Physical Exam   Blood pressure 127/84, pulse 89, temperature 98.2 F (36.8 C), temperature source Oral, resp. rate 16, weight 225 lb (102.1 kg), last menstrual period 02/22/2017.  Physical Exam  Nursing note and vitals reviewed. Constitutional: She is oriented to person, place, and time. She appears well-developed and well-nourished. No distress.  HENT:  Head: Normocephalic and atraumatic.  Eyes: Conjunctivae are normal. Right eye exhibits no discharge. Left eye exhibits no discharge. No scleral icterus.  Neck: Normal range of motion.  Respiratory: Effort normal. No respiratory distress.  Neurological: She is alert and oriented to person, place, and time.  Skin: Skin is warm and dry. She is not diaphoretic.  Psychiatric: She has a normal mood and affect. Her behavior is normal. Judgment and thought content normal.    MAU Course  Procedures No results found for this or any previous visit (from the past 24 hour(s)).  MDM FHT 149 VSS, NAD Using interpreter I scheduled patient for initial OB appt & diabetes education appt for next week. Patient agreeable to plan  Assessment and  Plan  A: 1. Physically well but worried   2. Fetal heart tones present, second trimester    P: Discharge home Go to [place] Next Thursday for OB appt Discussed reasons to return to [place] 06/17/2017, 10:42 AM

## 2017-06-21 ENCOUNTER — Telehealth: Payer: Self-pay | Admitting: General Practice

## 2017-06-21 NOTE — Telephone Encounter (Signed)
Called patient with pacific interpreter [phone], no answer- left message to call us back concerning a prescription. Per chart review, patient has appt Thursday

## 2017-06-21 NOTE — Telephone Encounter (Signed)
-----   Message from Tereso NewcomerUgonna A Anyanwu, MD sent at 06/17/2017 10:07 PM EDT ----- Regarding: Needs CBG check and to be told to restart Metformin for now  Patient needs to be called and advised to restart her Metformin until her appointment with diabetic educator, will also come in for fasting blood sugar check next week.  Needs new OB appointment in the next 1-2 weeks.  Thank you!  Arabic speaking only.  UAA

## 2017-06-23 ENCOUNTER — Ambulatory Visit (INDEPENDENT_AMBULATORY_CARE_PROVIDER_SITE_OTHER): Payer: No Typology Code available for payment source | Admitting: Obstetrics & Gynecology

## 2017-06-23 ENCOUNTER — Telehealth: Payer: Self-pay | Admitting: Lab

## 2017-06-23 ENCOUNTER — Encounter: Payer: Self-pay | Admitting: Obstetrics & Gynecology

## 2017-06-23 VITALS — BP 132/83 | HR 86 | Wt 221.2 lb

## 2017-06-23 DIAGNOSIS — O0990 Supervision of high risk pregnancy, unspecified, unspecified trimester: Secondary | ICD-10-CM | POA: Insufficient documentation

## 2017-06-23 DIAGNOSIS — O26899 Other specified pregnancy related conditions, unspecified trimester: Secondary | ICD-10-CM | POA: Insufficient documentation

## 2017-06-23 DIAGNOSIS — O24319 Unspecified pre-existing diabetes mellitus in pregnancy, unspecified trimester: Secondary | ICD-10-CM

## 2017-06-23 DIAGNOSIS — O0992 Supervision of high risk pregnancy, unspecified, second trimester: Secondary | ICD-10-CM

## 2017-06-23 DIAGNOSIS — Z3689 Encounter for other specified antenatal screening: Secondary | ICD-10-CM

## 2017-06-23 DIAGNOSIS — O09892 Supervision of other high risk pregnancies, second trimester: Secondary | ICD-10-CM

## 2017-06-23 DIAGNOSIS — Z6791 Unspecified blood type, Rh negative: Secondary | ICD-10-CM

## 2017-06-23 LAB — POCT URINALYSIS DIP (DEVICE)
BILIRUBIN URINE: NEGATIVE
Glucose, UA: NEGATIVE mg/dL
Hgb urine dipstick: NEGATIVE
KETONES UR: NEGATIVE mg/dL
LEUKOCYTES UA: NEGATIVE
Nitrite: NEGATIVE
PROTEIN: NEGATIVE mg/dL
Urobilinogen, UA: 0.2 mg/dL (ref 0.0–1.0)
pH: 5.5 (ref 5.0–8.0)

## 2017-06-23 MED ORDER — METFORMIN HCL 500 MG PO TABS: 500.0000 mg | ORAL_TABLET | Freq: Two times a day (BID) | ORAL | 3 refills | Status: DC

## 2017-06-23 MED ORDER — PREPLUS 27-1 MG PO TABS: 1.0000 | ORAL_TABLET | Freq: Every day | ORAL | 13 refills | Status: DC

## 2017-06-23 MED ORDER — ASPIRIN EC 81 MG PO TBEC: 81.0000 mg | DELAYED_RELEASE_TABLET | Freq: Every day | ORAL | 2 refills | Status: DC

## 2017-06-23 MED ORDER — GLUCOSE BLOOD VI STRP: ORAL_STRIP | 12 refills | Status: DC

## 2017-06-23 MED ORDER — GLUCOCOM LANCETS 28G MISC: 3 refills | Status: DC

## 2017-06-23 NOTE — Telephone Encounter (Signed)
-----   Message from Vivien Rotaheryl A Clinton sent at 06/22/2017  2:51 PM EDT ----- Regarding: FW: Needs CBG check and to be told to restart Metformin for now   ----- Message ----- From: Tereso NewcomerAnyanwu, Ugonna A, MD Sent: 06/17/2017  10:07 PM To: [facility], [facility] Subject: Needs CBG check and to be told to restart Me#   Patient needs to be called and advised to restart her Metformin until her appointment with diabetic educator, will also come in for fasting blood sugar check next week.  Needs new OB appointment in the next 1-2 weeks.  Thank you!  Arabic speaking only.  UAA

## 2017-06-23 NOTE — Patient Instructions (Signed)
Type 1 or Type 2 Diabetes Mellitus During Pregnancy, Self Care Caring for yourself during your pregnancy when you have type 1 diabetes (type 1 diabetes mellitus) or type 2 diabetes (type 2 diabetes mellitus) means keeping your blood sugar (glucose) under control with a balance of:  Nutrition.  Exercise.  Lifestyle changes.  Insulin or medicines, if necessary.  Support from your team of health care providers and others.  The following information explains what you need to know to manage your diabetes at home during your pregnancy. What do I need to do to manage my blood glucose?  Check your blood glucose every day, as often as told by your health care provider.  Contact your health care provider if your blood glucose is above your target for 2 tests in a row.  Have your A1c (hemoglobin A1c) level checked at least two times a year, or as often as told by your health care provider. Your health care provider will set individualized treatment goals for you. Generally, the goal of treatment is to maintain the following blood glucose levels during pregnancy:  After not eating (after fasting) for 8 hours: at or below 95 mg/dL (5.3 mmol/L).  After meals (postprandial): ? One hour after a meal: at or below 140 mg/dL (7.8 mmol/L). ? Two hours after a meal: at or below 120 mg/dL (6.7 mmol/L).  A1c level: 6-6.5%  What do I need to know about hyperglycemia and hypoglycemia? What is hyperglycemia? Hyperglycemia, also called high blood glucose, occurs when blood glucose is too high. Make sure you know the early signs of hyperglycemia, such as:  Increased thirst.  Hunger.  Feeling very tired.  Needing to urinate more often than usual.  Blurry vision.  What is hypoglycemia? Hypoglycemia, also called low blood glucose, occurs with a blood glucose level at or below 70 mg/dL (3.9 mmol/L). The risk for hypoglycemia increases during or after exercise, during sleep, during illness, and when  skipping meals or fasting. It is important to know the symptoms of hypoglycemia and treat it right away. Always have a 15-gram rapid-acting carbohydrate snack with you to treat low blood glucose. Family members and close friends should also know the symptoms and should understand how to treat hypoglycemia, in case you are not able to treat yourself. What are the symptoms of hypoglycemia? Hypoglycemia symptoms can include:  Hunger.  Anxiety.  Sweating and feeling clammy.  Confusion.  Dizziness or feeling light-headed.  Sleepiness.  Nausea.  Increased heart rate.  Headache.  Blurry vision.  Seizure.  Nightmares.  Tingling or numbness around the mouth, lips, or tongue.  A change in speech.  Decreased ability to concentrate.  A change in coordination.  Restless sleep.  Tremors or shakes.  Fainting.  Irritability.  How do I treat hypoglycemia?  If you are alert and able to swallow safely, follow the 15:15 rule:  Take 15 grams of a rapid-acting carbohydrate. Rapid-acting options include: ? 1 tube of glucose gel. ? 3 glucose pills. ? 6-8 pieces of hard candy. ? 4 oz (120 mL) of fruit juice . ? 4 oz (120 mL) of regular (not diet) soda.  Check your blood glucose 15 minutes after you take the carbohydrate.  If the repeat blood glucose level is still at or below 70 mg/dL (3.9 mmol/L), take 15 grams of a carbohydrate again.  If your blood glucose level does not increase above 70 mg/dL (3.9 mmol/L) after 3 tries, seek emergency medical care.  After your blood glucose level returns  to normal, eat a meal or a snack within 1 hour.  How do I treat severe hypoglycemia? Severe hypoglycemia is when your blood glucose level is at or below 54 mg/dL (3 mmol/L). Severe hypoglycemia is an emergency. Do not wait to see if the symptoms will go away. Get medical help right away. Call your local emergency services (911 in the [place] Do not drive yourself to the hospital. If you  have severe hypoglycemia and you cannot eat or drink, you may need an injection of glucagon. A family member or close friend should learn how to check your blood glucose and how to give you a glucagon injection. Ask your health care provider if you need to have an emergency glucagon injection kit available. Severe hypoglycemia may need to be treated in a hospital. The treatment may include getting glucose through an IV tube. You may also need treatment for the cause of your hypoglycemia. Can having diabetes put me at risk for other conditions? Having diabetes can put you at risk for other long-term (chronic) conditions, such as heart disease and kidney disease. Your health care provider may prescribe medicines to help prevent complications from diabetes. These medicines may include:  Aspirin.  Medicine to lower cholesterol.  Medicine to control blood pressure.  What else can I do to manage my diabetes? Take your diabetes medicines as told  If your health care provider prescribed insulin or diabetes medicines, take them every day.  Do not run out of insulin or other diabetes medicines that you take. Plan ahead so you always have these available.  If you use insulin, adjust your dosage based on how physically active you are and what foods you eat. Your health care provider will tell you how to adjust your dosage. Your health care provider may recommend that you take one low-dose aspirin (81 mg) each day to help prevent high blood pressure during pregnancy (preeclampsia or eclampsia). You may be at risk for preeclampsia or eclampsia if:  You had any of the following during a previous pregnancy: ? Preeclampsia or eclampsia. ? A fetal growth rate that was slower than normal. ? An early (preterm) birth. ? Separation of the placenta from the uterus (placental abruption). ? Fetal loss.  You are pregnant with more than one baby.  You have other medical conditions, such as high blood pressure or  an autoimmune disease.  Make healthy food choices  The things that you eat and drink affect your blood glucose and your insulin dosage. Making good choices helps to control your diabetes and prevent other health problems. A healthy meal plan includes eating lean proteins, complex carbohydrates, fresh fruits and vegetables, low-fat dairy products, and healthy fats. Make an appointment to see a diet and nutrition specialist (registered dietitian) to help you create an eating plan that is right for you. Make sure that you:  Follow instructions from your health care provider about eating or drinking restrictions.  Drink enough fluid to keep your urine clear or pale yellow.  Eat healthy snacks between nutritious meals.  Track the carbohydrates that you eat. Do this by reading food labels and learning the standard serving sizes of foods.  Follow your sick day plan whenever you cannot eat or drink as usual. Make this plan in advance with your health care provider.  Stay active   Do at least 30 minutes of physical activity a day, or as much physical activity as your health care provider recommends during your pregnancy.  If you start  a new exercise or activity, work with your health care provider to adjust your insulin, medicines, or food intake as needed. Make healthy lifestyle choices  Do not use any tobacco products, such as cigarettes, chewing tobacco, and e-cigarettes. If you need help quitting, ask your health care provider.  Do not use alcohol.  Learn to manage stress. If you need help with this, ask your health care provider. Care for your body  Keep your immunizations up to date.  Schedule an eye exam during your first trimester of your pregnancy, or as told by your health care provider.  Check your skin and feet every day for cuts, bruises, redness, blisters, or sores. Schedule a foot exam with your health care provider once every year.  Brush your teeth and gums two times a  day, and floss at least one time a day. Visit your dentist at least once every 6 months.  Maintain a healthy weight during your pregnancy. General instructions   Take over-the-counter and prescription medicines only as told by your health care provider.  Talk with your health care provider about your risk for high blood pressure during pregnancy (preeclampsia or eclampsia).  Share your diabetes management plan with people in your workplace, school, and household.  Check your urine ketones when you are ill and as told by your health care provider.  Carry a medical alert card or wear medical alert jewelry.  Ask your health care provider: ? Do I need to meet with a diabetes educator? ? Where can I find a support group for people with diabetes?  Keep all follow-up visits during your pregnancy (prenatal) and after delivery (postnatal) as told by your health care provider. This is important. Where to find more information: For more information about diabetes, visit:  American Diabetes Association (ADA): www.diabetes.org  American Association of Diabetes Educators (AADE): https://www.diabeteseducator.org/patient-resources  This information is not intended to replace advice given to you by your health care provider. Make sure you discuss any questions you have with your health care provider. Document Released: 03/08/2016 Document Revised: 04/22/2016 Document Reviewed: 12/19/2015 Elsevier Interactive Patient Education  [employer].

## 2017-06-23 NOTE — Telephone Encounter (Signed)
Attempted to contact patient with [facility] [phone]). No answer left message for patient to start taking Metformin and to also remind patient of appointment with Diabetic counselor on 8/2.

## 2017-06-23 NOTE — Progress Notes (Signed)
Subjective:   Kayla George is a 26 y.o. G2P1001 at 83w2dby LMP and 7 week ultrasound being seen today for her first obstetrical visit. Due to language barrier, an Arabic interpreter was present during the history-taking and subsequent discussion (and for part of the physical exam) with this patient.   Her obstetrical history is significant for preexisting DM.  Was on Metformin in the past, not currently taking, last HgA1C was 9.6 in 04/22/2017. Not checking blood sugars.  Patient does intend to breast feed. Pregnancy history fully reviewed.  Patient reports occasional dental pain, planning to see dentist soon. No pregnancy complaints.  HISTORY: Obstetric History   G2   P1   T1   P0   A0   L1    SAB0   TAB0   Ectopic0   Multiple0   Live Births1     # Outcome Date GA Lbr Len/2nd Weight Sex Delivery Anes PTL Lv  2 Current           1 Term 11/10/11 482w0d M Vag-Spont   LIV     Past Medical History:  Diagnosis Date  . [facility]   Past Surgical History:  Procedure Laterality Date  . NO PAST SURGERIES     History reviewed. No pertinent family history. Social History  Substance Use Topics  . Smoking status: Never Smoker  . Smokeless tobacco: Never Used  . Alcohol use No   Allergies  Allergen Reactions  . Eggs Or Egg-Derived Products Rash   Current Outpatient Prescriptions on File Prior to Visit  Medication Sig Dispense Refill  . blood glucose meter kit and supplies KIT Check blood sugar TID & QHS. (FOR ICD-9 250.00, 250.01). (Patient not taking: Reported on 7/0/48/88911 each 0  . folic acid (FOLVITE) 1 MG tablet Take 1 tablet (1 mg total) by mouth daily. (Patient not taking: Reported on 06/23/2017) 90 tablet 3   No current facility-administered medications on file prior to visit.      Exam   Vitals:   06/23/17 1148  BP: 132/83  Pulse: 86  Weight: 221 lb 3.2 oz (100.3 kg)   Fetal Heart Rate (bpm): 152  Uterus:   17 week sized    Pelvic Exam: Deferred   System: General: well-developed, well-nourished female in no acute distress   Breast:  normal appearance, no masses or tenderness   Skin: normal coloration and turgor, no rashes   Neurologic: oriented, normal, negative, normal mood   Extremities: normal strength, tone, and muscle mass, ROM of all joints is normal   HEENT PERRLA, extraocular movement intact and sclera clear, anicteric   Mouth/Teeth mucous membranes moist, pharynx normal without lesions and dental hygiene good   Neck supple and no masses   Cardiovascular: regular rate and rhythm   Respiratory:  no respiratory distress, normal breath sounds   Abdomen: soft, non-tender; bowel sounds normal; no masses,  no organomegaly     Assessment:   Pregnancy: G2P1001 Patient Active Problem List   Diagnosis Date Noted  . Preexisting diabetes complicating pregnancy, antepartum 06/23/2017  . Supervision of high-risk pregnancy 06/23/2017  . Rh negative status during pregnancy 06/23/2017     Plan:  1. Preexisting diabetes complicating pregnancy, antepartum Discussed implications of DM in pregnancy, need for optimizing glycemic control to decrease DM associated maternal-fetal morbidity and mortality, possible need for antenatal testing and frequent ultrasounds/prenatal visits. All questions answered. Baseline labs, other studies ordered.  Aspirin prescribed.  Patient will restart Metformin and check blood sugars, has appointment with DM educator in one week (8/2/20180.  - Comprehensive metabolic panel - Hemoglobin A1c - TSH - Protein / creatinine ratio, urine - [place] MFM OB DETAIL +14 WK; Future - US Fetal Echocardiography; Future - Ambulatory referral to Ophthalmology - metFORMIN (GLUCOPHAGE) 500 MG tablet; Take 1 tablet (500 mg total) by mouth 2 (two) times daily with a meal.  Dispense: 60 tablet; Refill: 3 - aspirin EC 81 MG tablet; Take 1 tablet (81 mg total) by mouth daily. Take after 12 weeks for prevention of  preeclampsia later in pregnancy  Dispense: 300 tablet; Refill: 2 - GlucoCom Lancets MISC; Check blood sugar four times a day as instructed  Dispense: 100 each; Refill: 3 - glucose blood test strip; Use as instructed  Dispense: 100 each; Refill: 12  2. Rh negative, antepartum Needs Rhogam at 28 weeks  3. Encounter for fetal anatomic survey Anatomy scan ordered - US MFM OB DETAIL +14 WK; Future  4. Supervision of high risk pregnancy in second trimester Initial labs drawn. Continue prenatal vitamins. Genetic Screening discussed, Quad screen: ordered. - Obstetric Panel, Including HIV - Culture, OB Urine - Hemoglobinopathy evaluation - Cystic Fibrosis Mutation 97 - SMN1 COPY NUMBER ANALYSIS (SMA Carrier Screen) - Urine cytology ancillary only - AFP TETRA - Prenatal Vit-Fe Fumarate-FA (PREPLUS) 27-1 MG TABS; Take 1 tablet by mouth daily.  Dispense: 30 tablet; Refill: 13  Problem list reviewed and updated. The nature of [place] with multiple MDs and other Advanced Practice Providers was explained to patient; also emphasized that residents, students are part of our team. Routine obstetric precautions reviewed. Return in one week for DM education and in  about 2 weeks (around 07/07/2017) for OB Visit ([place]).     Verita Schneiders, MD, [place] Attending [place], [facility] for [employer], [place]

## 2017-06-24 ENCOUNTER — Telehealth: Payer: Self-pay | Admitting: Lab

## 2017-06-24 LAB — PROTEIN / CREATININE RATIO, URINE
Creatinine, Urine: 249.6 mg/dL
PROTEIN/CREAT RATIO: 77 mg/g{creat} (ref 0–200)
Protein, Ur: 19.3 mg/dL

## 2017-06-24 NOTE — Telephone Encounter (Signed)
Patient has Fetal Echo appointment with Dr. Elizebeth Brookingotton 9/14 @10 :30am and [facility] 10[facility]. Attempted to contact patient with interpreter services but no answer. Left message about her appointment.

## 2017-06-24 NOTE — Telephone Encounter (Signed)
Using [facility] [phone]( [phone]). Attempted to Called patient to tell her of her appointments but she didn't answer, Left appointment information on answering machine.

## 2017-06-25 LAB — CULTURE, OB URINE

## 2017-06-25 LAB — URINE CULTURE, OB REFLEX: Organism ID, Bacteria: NO GROWTH

## 2017-06-29 LAB — AFP TETRA
DIA Mom Value: 0.59
DIA VALUE (EIA): 77.92 pg/mL
DSR (By Age)    1 IN: 987
DSR (SECOND TRIMESTER) 1 IN: 10000
GESTATIONAL AGE AFP: 17.3 wk
MSAFP Mom: 1.58
MSAFP: 38.9 ng/mL
MSHCG Mom: 0.88
MSHCG: 20566 m[IU]/mL
Maternal Age At EDD: 25.9 yr
Osb Risk: 660
Test Results:: NEGATIVE
UE3 MOM: 1.1
UE3 VALUE: 1.07 ng/mL
Weight: 221 [lb_av]

## 2017-06-30 ENCOUNTER — Other Ambulatory Visit: Payer: No Typology Code available for payment source

## 2017-06-30 ENCOUNTER — Other Ambulatory Visit: Payer: Self-pay | Admitting: Advanced Practice Midwife

## 2017-06-30 ENCOUNTER — Encounter: Payer: No Typology Code available for payment source | Attending: Obstetrics & Gynecology | Admitting: *Deleted

## 2017-06-30 DIAGNOSIS — Z794 Long term (current) use of insulin: Secondary | ICD-10-CM | POA: Diagnosis not present

## 2017-06-30 DIAGNOSIS — Z7982 Long term (current) use of aspirin: Secondary | ICD-10-CM | POA: Insufficient documentation

## 2017-06-30 DIAGNOSIS — O24319 Unspecified pre-existing diabetes mellitus in pregnancy, unspecified trimester: Secondary | ICD-10-CM | POA: Diagnosis present

## 2017-06-30 DIAGNOSIS — Z3A00 Weeks of gestation of pregnancy not specified: Secondary | ICD-10-CM | POA: Insufficient documentation

## 2017-06-30 DIAGNOSIS — Z713 Dietary counseling and surveillance: Secondary | ICD-10-CM | POA: Insufficient documentation

## 2017-06-30 MED ORDER — METFORMIN HCL 500 MG PO TABS: 500.0000 mg | ORAL_TABLET | Freq: Two times a day (BID) | ORAL | 3 refills | Status: DC

## 2017-06-30 NOTE — Progress Notes (Signed)
[facility]. Pt to start medication right away and f/u nurse visit scheduled for early next week to check blood glucose.

## 2017-07-03 ENCOUNTER — Encounter (HOSPITAL_COMMUNITY): Payer: Self-pay

## 2017-07-03 ENCOUNTER — Inpatient Hospital Stay (HOSPITAL_COMMUNITY): Payer: No Typology Code available for payment source

## 2017-07-03 ENCOUNTER — Inpatient Hospital Stay (HOSPITAL_COMMUNITY)
Admission: AD | Admit: 2017-07-03 | Discharge: 2017-07-03 | Disposition: A | Discharge status: Home / Self Care | Payer: No Typology Code available for payment source | Source: Ambulatory Visit | Attending: Obstetrics & Gynecology | Admitting: Obstetrics & Gynecology

## 2017-07-03 DIAGNOSIS — O26893 Other specified pregnancy related conditions, third trimester: Secondary | ICD-10-CM | POA: Diagnosis present

## 2017-07-03 DIAGNOSIS — Z3A18 18 weeks gestation of pregnancy: Secondary | ICD-10-CM | POA: Insufficient documentation

## 2017-07-03 DIAGNOSIS — O26899 Other specified pregnancy related conditions, unspecified trimester: Secondary | ICD-10-CM

## 2017-07-03 DIAGNOSIS — R109 Unspecified abdominal pain: Secondary | ICD-10-CM | POA: Diagnosis not present

## 2017-07-03 DIAGNOSIS — Z7982 Long term (current) use of aspirin: Secondary | ICD-10-CM | POA: Insufficient documentation

## 2017-07-03 DIAGNOSIS — O9A212 Injury, poisoning and certain other consequences of external causes complicating pregnancy, second trimester: Secondary | ICD-10-CM
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
## 2017-07-04 ENCOUNTER — Ambulatory Visit: Payer: No Typology Code available for payment source | Admitting: *Deleted

## 2017-07-04 DIAGNOSIS — Z712 Person consulting for explanation of examination or test findings: Secondary | ICD-10-CM

## 2017-07-04 LAB — SMN1 COPY NUMBER ANALYSIS (SMA CARRIER SCREENING)

## 2017-07-04 LAB — HEMOGLOBINOPATHY EVALUATION
HEMOGLOBIN F QUANTITATION: 0 % (ref 0.0–2.0)
HGB A: 97.2 % (ref 96.4–98.8)
HGB C: 0 %
HGB S: 0 %
HGB VARIANT: 0 %
Hemoglobin A2 Quantitation: 2.8 % (ref 1.8–3.2)

## 2017-07-04 LAB — COMPREHENSIVE METABOLIC PANEL
ALK PHOS: 100 IU/L (ref 39–117)
ALT: 13 IU/L (ref 0–32)
AST: 15 IU/L (ref 0–40)
Albumin/Globulin Ratio: 1.3 (ref 1.2–2.2)
Albumin: 3.8 g/dL (ref 3.5–5.5)
BUN/Creatinine Ratio: 10 (ref 9–23)
BUN: 5 mg/dL — ABNORMAL LOW (ref 6–20)
Bilirubin Total: 0.4 mg/dL (ref 0.0–1.2)
CALCIUM: 9.5 mg/dL (ref 8.7–10.2)
CO2: 19 mmol/L — AB (ref 20–29)
CREATININE: 0.52 mg/dL — AB (ref 0.57–1.00)
Chloride: 103 mmol/L (ref 96–106)
GFR calc Af Amer: 154 mL/min/{1.73_m2} (ref >59)
GFR, EST NON AFRICAN AMERICAN: 133 mL/min/{1.73_m2} (ref >59)
GLOBULIN, TOTAL: 2.9 g/dL (ref 1.5–4.5)
Glucose: 109 mg/dL — ABNORMAL HIGH (ref 65–99)
POTASSIUM: 3.8 mmol/L (ref 3.5–5.2)
SODIUM: 139 mmol/L (ref 134–144)
Total Protein: 6.7 g/dL (ref 6.0–8.5)

## 2017-07-04 LAB — OBSTETRIC PANEL, INCLUDING HIV
ANTIBODY SCREEN: NEGATIVE
BASOS ABS: 0 10*3/uL (ref 0.0–0.2)
BASOS: 0 %
EOS (ABSOLUTE): 0.1 10*3/uL (ref 0.0–0.4)
Eos: 1 %
HEMATOCRIT: 40.1 % (ref 34.0–46.6)
HIV SCREEN 4TH GENERATION: NONREACTIVE
Hemoglobin: 13.1 g/dL (ref 11.1–15.9)
Hepatitis B Surface Ag: NEGATIVE
Immature Grans (Abs): 0 10*3/uL (ref 0.0–0.1)
Immature Granulocytes: 0 %
LYMPHS: 28 %
Lymphocytes Absolute: 1.5 10*3/uL (ref 0.7–3.1)
MCH: 27.9 pg (ref 26.6–33.0)
MCHC: 32.7 g/dL (ref 31.5–35.7)
MCV: 85 fL (ref 79–97)
MONOCYTES: 7 %
MONOS ABS: 0.4 10*3/uL (ref 0.1–0.9)
NEUTROS ABS: 3.6 10*3/uL (ref 1.4–7.0)
Neutrophils: 64 %
PLATELETS: 283 10*3/uL (ref 150–379)
RBC: 4.7 x10E6/uL (ref 3.77–5.28)
RDW: 14.4 % (ref 12.3–15.4)
RPR Ser Ql: NONREACTIVE
RUBELLA: 4.47 {index} (ref >0.99)
Rh Factor: NEGATIVE
WBC: 5.6 10*3/uL (ref 3.4–10.8)

## 2017-07-04 LAB — CYSTIC FIBROSIS MUTATION 97: GENE DIS ANAL CARRIER INTERP BLD/T-IMP: NOT DETECTED

## 2017-07-04 LAB — TSH: TSH: 0.801 u[IU]/mL (ref 0.450–4.500)

## 2017-07-04 LAB — HEMOGLOBIN A1C
Est. average glucose Bld gHb Est-mCnc: 166 mg/dL
HEMOGLOBIN A1C: 7.4 % — AB (ref 4.8–5.6)

## 2017-07-04 NOTE — Progress Notes (Signed)
Patient presents to clinic to review blood sugars. Patient brought her log and glucometer. However I was unable to read her log. Reviewed readings stored in her glucometer. She has been inconsistent with sugar checks. Fasting sugar 84 on 8/5, evening sugar 135 also on 8/5. All other sugars 70s-80s.  Encouraged patient to check sugars 4 times daily, starting first thing before breakfast. F/u with Dr Penne LashLeggett at scheduled appt on 8/13. Understanding voiced.

## 2017-07-07 ENCOUNTER — Encounter: Payer: Self-pay | Admitting: Advanced Practice Midwife

## 2017-07-07 ENCOUNTER — Ambulatory Visit: Payer: Self-pay

## 2017-07-08 ENCOUNTER — Encounter (HOSPITAL_COMMUNITY): Payer: Self-pay

## 2017-07-08 ENCOUNTER — Ambulatory Visit (HOSPITAL_COMMUNITY)
Admission: RE | Admit: 2017-07-08 | Discharge: 2017-07-08 | Disposition: A | Discharge status: Home / Self Care | Payer: No Typology Code available for payment source | Source: Ambulatory Visit | Attending: Obstetrics & Gynecology | Admitting: Obstetrics & Gynecology

## 2017-07-08 ENCOUNTER — Other Ambulatory Visit: Payer: Self-pay | Admitting: Obstetrics & Gynecology

## 2017-07-08 ENCOUNTER — Other Ambulatory Visit (HOSPITAL_COMMUNITY): Payer: Self-pay | Admitting: *Deleted

## 2017-07-08 DIAGNOSIS — Z3A19 19 weeks gestation of pregnancy: Secondary | ICD-10-CM

## 2017-07-08 DIAGNOSIS — Z7984 Long term (current) use of oral hypoglycemic drugs: Secondary | ICD-10-CM | POA: Diagnosis not present

## 2017-07-08 DIAGNOSIS — O24319 Unspecified pre-existing diabetes mellitus in pregnancy, unspecified trimester: Secondary | ICD-10-CM

## 2017-07-08 DIAGNOSIS — Z0489 Encounter for examination and observation for other specified reasons: Secondary | ICD-10-CM

## 2017-07-08 DIAGNOSIS — IMO0002 Reserved for concepts with insufficient information to code with codable children: Secondary | ICD-10-CM

## 2017-07-08 DIAGNOSIS — O24112 Pre-existing diabetes mellitus, type 2, in pregnancy, second trimester: Secondary | ICD-10-CM | POA: Insufficient documentation

## 2017-07-08 DIAGNOSIS — Z3689 Encounter for other specified antenatal screening: Secondary | ICD-10-CM | POA: Diagnosis present

## 2017-07-11 ENCOUNTER — Encounter: Payer: Self-pay | Admitting: Obstetrics & Gynecology

## 2017-08-05 ENCOUNTER — Ambulatory Visit (HOSPITAL_COMMUNITY)
Admission: RE | Admit: 2017-08-05 | Discharge: 2017-08-05 | Disposition: A | Discharge status: Home / Self Care | Payer: No Typology Code available for payment source | Source: Ambulatory Visit | Attending: Obstetrics & Gynecology | Admitting: Obstetrics & Gynecology

## 2017-08-05 DIAGNOSIS — Z048 Encounter for examination and observation for other specified reasons: Secondary | ICD-10-CM | POA: Diagnosis not present

## 2017-08-05 DIAGNOSIS — Z0489 Encounter for examination and observation for other specified reasons: Secondary | ICD-10-CM

## 2017-08-05 DIAGNOSIS — Z3A01 Less than 8 weeks gestation of pregnancy: Secondary | ICD-10-CM | POA: Insufficient documentation

## 2017-08-05 DIAGNOSIS — IMO0002 Reserved for concepts with insufficient information to code with codable children: Secondary | ICD-10-CM

## 2017-08-05 NOTE — Addendum Note (Signed)
Encounter addended by: Genevie CheshireWaken, Heatherly Stenner M, RT on: 08/05/2017  4:17 PM<BR>    Actions taken: Imaging Exam ended

## 2017-09-19 ENCOUNTER — Encounter: Payer: Self-pay | Admitting: Family Medicine

## 2017-09-19 ENCOUNTER — Inpatient Hospital Stay (HOSPITAL_COMMUNITY)
Admission: AD | Admit: 2017-09-19 | Discharge: 2017-09-19 | Disposition: A | Discharge status: Home / Self Care | Payer: No Typology Code available for payment source | Source: Ambulatory Visit | Attending: Obstetrics and Gynecology | Admitting: Obstetrics and Gynecology

## 2017-09-19 ENCOUNTER — Encounter (HOSPITAL_COMMUNITY): Payer: Self-pay | Admitting: *Deleted

## 2017-09-19 ENCOUNTER — Telehealth: Payer: Self-pay | Admitting: Family Medicine

## 2017-09-19 DIAGNOSIS — B373 Candidiasis of vulva and vagina: Secondary | ICD-10-CM | POA: Diagnosis not present

## 2017-09-19 DIAGNOSIS — O24113 Pre-existing diabetes mellitus, type 2, in pregnancy, third trimester: Secondary | ICD-10-CM | POA: Insufficient documentation

## 2017-09-19 DIAGNOSIS — O98813 Other maternal infectious and parasitic diseases complicating pregnancy, third trimester: Secondary | ICD-10-CM

## 2017-09-19 DIAGNOSIS — O0993 Supervision of high risk pregnancy, unspecified, third trimester: Secondary | ICD-10-CM

## 2017-09-19 DIAGNOSIS — O24313 Unspecified pre-existing diabetes mellitus in pregnancy, third trimester: Secondary | ICD-10-CM

## 2017-09-19 DIAGNOSIS — Z3A29 29 weeks gestation of pregnancy: Secondary | ICD-10-CM | POA: Insufficient documentation

## 2017-09-19 DIAGNOSIS — O26899 Other specified pregnancy related conditions, unspecified trimester: Secondary | ICD-10-CM

## 2017-09-19 DIAGNOSIS — B3731 Acute candidiasis of vulva and vagina: Secondary | ICD-10-CM | POA: Diagnosis present

## 2017-09-19 DIAGNOSIS — O24319 Unspecified pre-existing diabetes mellitus in pregnancy, unspecified trimester: Secondary | ICD-10-CM

## 2017-09-19 DIAGNOSIS — E119 Type 2 diabetes mellitus without complications: Secondary | ICD-10-CM | POA: Diagnosis not present

## 2017-09-19 DIAGNOSIS — Z7984 Long term (current) use of oral hypoglycemic drugs: Secondary | ICD-10-CM | POA: Insufficient documentation

## 2017-09-19 DIAGNOSIS — Z7982 Long term (current) use of aspirin: Secondary | ICD-10-CM | POA: Diagnosis not present

## 2017-09-19 DIAGNOSIS — O26853 Spotting complicating pregnancy, third trimester: Secondary | ICD-10-CM

## 2017-09-19 DIAGNOSIS — Z6791 Unspecified blood type, Rh negative: Secondary | ICD-10-CM

## 2017-09-19 LAB — WET PREP, GENITAL
Clue Cells Wet Prep HPF POC: NONE SEEN
Sperm: NONE SEEN
Trich, Wet Prep: NONE SEEN

## 2017-09-19 LAB — URINALYSIS, ROUTINE W REFLEX MICROSCOPIC
BILIRUBIN URINE: NEGATIVE
GLUCOSE, UA: NEGATIVE mg/dL
KETONES UR: NEGATIVE mg/dL
NITRITE: NEGATIVE
PH: 5 (ref 5.0–8.0)
Protein, ur: 100 mg/dL — AB
Specific Gravity, Urine: 1.021 (ref 1.005–1.030)

## 2017-09-19 MED ORDER — GLYBURIDE 5 MG PO TABS: 5.0000 mg | ORAL_TABLET | Freq: Two times a day (BID) | ORAL | 1 refills | Status: DC

## 2017-09-19 MED ORDER — TERCONAZOLE 0.4 % VA CREA: 1.0000 | TOPICAL_CREAM | Freq: Every day | VAGINAL | 0 refills | Status: AC

## 2017-09-19 NOTE — Telephone Encounter (Signed)
Called patient with an Interpreter to give her an appointment information. Phone was not receiving voicemail's.

## 2017-09-19 NOTE — MAU Note (Signed)
Yesterday, she started having some bleeding. Bleeding has continued.  Is not heavy, no pain.  No recent intercourse.  No placental issues.

## 2017-09-19 NOTE — MAU Provider Note (Signed)
History     CSN: [id number]  Arrival date and time: 09/19/17 1209   First Provider Initiated Contact with Patient 09/19/17 1305 // All assessments, exams and results interpreted by [employer] video interpreter [phone]    Chief Complaint  Patient presents with  . Vaginal Bleeding   HPI  Kayla George is a 26 y.o. G2P1001 at 98w6dgestation presenting to MAU with complaints vaginal spotting since yesterday that is getting heavier.  She reports she sees the spotting when she lifts things and walks.  She denies recent SI.  She denies pain or LOF. She reports good (+) FM. [facility] but has not had an appt since July. She had an U/S on 9/7.  She has Pre-existing DM Type II on Metformin 500 mg po BID with meals.  She reports her FBS are 115 and 2 hr postprandials range from 119-120.  Past Medical History:  Diagnosis Date  . Diabetes mellitus without complication ([facility]     Past Surgical History:  Procedure Laterality Date  . NO PAST SURGERIES      History reviewed. No pertinent family history.  Social History  Substance Use Topics  . Smoking status: Never Smoker  . Smokeless tobacco: Never Used  . Alcohol use No    Allergies:  Allergies  Allergen Reactions  . Eggs Or Egg-Derived Products Rash    Prescriptions Prior to Admission  Medication Sig Dispense Refill Last Dose  . aspirin EC 81 MG tablet Take 1 tablet (81 mg total) by mouth daily. Take after 12 weeks for prevention of preeclampsia later in pregnancy 300 tablet 2 Taking  . blood glucose meter kit and supplies KIT Check blood sugar TID & QHS. (FOR ICD-9 250.00, 250.01). (Patient not taking: Reported on 06/23/2017) 1 each 0 Not Taking  . folic acid (FOLVITE) 1 MG tablet Take 1 tablet (1 mg total) by mouth daily. (Patient not taking: Reported on 06/23/2017) 90 tablet 3 Not Taking  . GlucoCom Lancets MISC Check blood sugar four times a day as instructed 100 each 3   . glucose blood test strip Use as  instructed 100 each 12   . metFORMIN (GLUCOPHAGE) 500 MG tablet Take 1 tablet (500 mg total) by mouth 2 (two) times daily with a meal. 60 tablet 3 Taking  . Prenatal Vit-Fe Fumarate-FA (PREPLUS) 27-1 MG TABS Take 1 tablet by mouth daily. 30 tablet 13 Taking    Review of Systems  Constitutional: Negative.   HENT: Negative.   Eyes: Negative.   Respiratory: Negative.   Cardiovascular: Negative.   Gastrointestinal: Negative.   Endocrine: Negative.   Genitourinary: Positive for vaginal bleeding.  Skin: Negative.   Allergic/Immunologic: Negative.   Neurological: Negative.   Hematological: Negative.   Psychiatric/Behavioral: Negative.    Physical Exam   Blood pressure 128/78, pulse 98, temperature 98.2 F (36.8 C), temperature source Oral, resp. rate 18, weight 105.3 kg (232 lb 4 oz), last menstrual period 02/22/2017, SpO2 100 %.  Physical Exam  Nursing note and vitals reviewed. Constitutional: She is oriented to person, place, and time. She appears well-developed and well-nourished.  HENT:  Head: Normocephalic.  Eyes: Pupils are equal, round, and reactive to light.  Neck: Normal range of motion.  Cardiovascular: Normal rate, regular rhythm and normal heart sounds.   Respiratory: Effort normal and breath sounds normal.  GI: Soft. Bowel sounds are normal.  Genitourinary:  Genitourinary Comments: Uterus: gravid, S=D, cx: smooth, pink, no lesions, small amt of thick, white vaginal  d/c -- no evidence of active bleeding or any signs of recent bleeding, closed/long/firm, no CMT or friability, no adnexal tenderness   Musculoskeletal: Normal range of motion.  Neurological: She is alert and oriented to person, place, and time.  Skin: Skin is warm and dry.  Psychiatric: She has a normal mood and affect. Her behavior is normal. Judgment and thought content normal.    MAU Course  Procedures  MDM CCUA Wet Prep NST - FHR: 145 bpm / moderate variability / accels present / decels absent /  TOCO: 2 UC's  Results for orders placed or performed during the hospital encounter of 09/19/17 (from the past 24 hour(s))  Wet prep, genital     Status: Abnormal   Collection Time: 09/19/17  1:17 PM  Result Value Ref Range   Yeast Wet Prep HPF POC PRESENT (A) NONE SEEN   Trich, Wet Prep NONE SEEN NONE SEEN   Clue Cells Wet Prep HPF POC NONE SEEN NONE SEEN   WBC, Wet Prep HPF POC MANY (A) NONE SEEN   Sperm NONE SEEN     Assessment and Plan  Candida vaginitis - Rx Terazol 7 per vagina x 5 hs - Information on Yeast Infection provided  Preexisting diabetes complicating pregnancy in third trimester, antepartum - Message sent to admin pool to get pt scheduled ASAP - Rx for Glyburide 5 mg PO BID - Continue Metformin 500 mg PO BID - OB MFM Detailed U/S ordered for tomorrow -- pending scheduling  Discharge home Patient verbalized an understanding of the plan of care and agrees.   Laury Deep, MSN, CNM 09/19/2017, 1:32 PM

## 2017-09-19 NOTE — MAU Note (Signed)
Pt presents with c/o VB that began yesterday.  Denies abdominal pain or LOF.  Pt reports +FM.

## 2017-09-22 ENCOUNTER — Other Ambulatory Visit: Payer: Self-pay

## 2017-09-22 ENCOUNTER — Encounter: Payer: Self-pay | Admitting: Obstetrics & Gynecology

## 2017-09-23 ENCOUNTER — Ambulatory Visit (INDEPENDENT_AMBULATORY_CARE_PROVIDER_SITE_OTHER): Payer: No Typology Code available for payment source | Admitting: Advanced Practice Midwife

## 2017-09-23 VITALS — BP 123/85 | HR 105 | Wt 230.3 lb

## 2017-09-23 DIAGNOSIS — Z23 Encounter for immunization: Secondary | ICD-10-CM

## 2017-09-23 DIAGNOSIS — O24313 Unspecified pre-existing diabetes mellitus in pregnancy, third trimester: Secondary | ICD-10-CM

## 2017-09-23 DIAGNOSIS — O0993 Supervision of high risk pregnancy, unspecified, third trimester: Secondary | ICD-10-CM

## 2017-09-23 DIAGNOSIS — O26899 Other specified pregnancy related conditions, unspecified trimester: Secondary | ICD-10-CM

## 2017-09-23 DIAGNOSIS — E1165 Type 2 diabetes mellitus with hyperglycemia: Secondary | ICD-10-CM

## 2017-09-23 DIAGNOSIS — Z6791 Unspecified blood type, Rh negative: Secondary | ICD-10-CM

## 2017-09-23 DIAGNOSIS — O0992 Supervision of high risk pregnancy, unspecified, second trimester: Secondary | ICD-10-CM

## 2017-09-23 DIAGNOSIS — O0990 Supervision of high risk pregnancy, unspecified, unspecified trimester: Secondary | ICD-10-CM

## 2017-09-23 DIAGNOSIS — O24319 Unspecified pre-existing diabetes mellitus in pregnancy, unspecified trimester: Secondary | ICD-10-CM

## 2017-09-23 LAB — GLUCOSE, CAPILLARY: GLUCOSE-CAPILLARY: 150 mg/dL — AB (ref 65–99)

## 2017-09-23 MED ORDER — METFORMIN HCL 500 MG PO TABS: 1000.0000 mg | ORAL_TABLET | Freq: Two times a day (BID) | ORAL | 3 refills | Status: DC

## 2017-09-23 MED ORDER — RHO D IMMUNE GLOBULIN 1500 UNIT/2ML IJ SOSY
300.0000 ug | PREFILLED_SYRINGE | Freq: Once | INTRAMUSCULAR | Status: AC
  Administered 2017-09-23: 300 ug via INTRAMUSCULAR

## 2017-09-23 MED ORDER — GLUCOSE BLOOD VI STRP: ORAL_STRIP | 12 refills | Status: DC

## 2017-09-23 MED ORDER — GLUCOCOM LANCETS 28G MISC: 3 refills | Status: DC

## 2017-09-23 MED ORDER — ASPIRIN EC 81 MG PO TBEC: 81.0000 mg | DELAYED_RELEASE_TABLET | Freq: Every day | ORAL | 2 refills | Status: DC

## 2017-09-23 MED ORDER — METFORMIN HCL 500 MG PO TABS: 500.0000 mg | ORAL_TABLET | Freq: Two times a day (BID) | ORAL | 3 refills | Status: DC

## 2017-09-23 MED ORDER — GLYBURIDE 5 MG PO TABS: 5.0000 mg | ORAL_TABLET | Freq: Two times a day (BID) | ORAL | 3 refills | Status: DC

## 2017-09-23 MED ORDER — PREPLUS 27-1 MG PO TABS: 1.0000 | ORAL_TABLET | Freq: Every day | ORAL | 13 refills | Status: DC

## 2017-09-23 NOTE — Progress Notes (Signed)
   PRENATAL VISIT NOTE  Subjective:  Kayla George is a 26 y.o. G2P1001 at 7479w3d being seen today for ongoing prenatal care.  She is currently monitored for the following issues for this high-risk pregnancy and has Preexisting diabetes complicating pregnancy, antepartum; Supervision of high-risk pregnancy; Rh negative status during pregnancy; and Candida vaginitis on her problem list.  Lapse in care since July. Seen in MAU.   Patient reports no complaints. Pt gave inconsistent reports of what meds she was taking. First said she was taking Glyburide and Metformin, then said she was only taking Metformin 500 BID for the past several weeks. Contractions: Not present. Vag. Bleeding: None.  Movement: Present. Denies leaking of fluid.   The following portions of the patient's history were reviewed and updated as appropriate: allergies, current medications, past family history, past medical history, past social history, past surgical history and problem list. Problem list updated.  Live Arabic interpreter used.   Objective:   Vitals:   09/23/17 1209  BP: 123/85  Pulse: (!) 105  Weight: 230 lb 4.8 oz (104.5 kg)    Fetal Status: Fetal Heart Rate (bpm): 152 Fundal Height: 32 cm Movement: Present     General:  Alert, oriented and cooperative. Patient is in no acute distress.  Skin: Skin is warm and dry. No rash noted.   Cardiovascular: Normal heart rate noted  Respiratory: Normal respiratory effort, no problems with respiration noted  Abdomen: Soft, gravid, appropriate for gestational age.  Pain/Pressure: Absent     Pelvic: Cervical exam deferred        Extremities: Normal range of motion.  Edema: None  Mental Status:  Normal mood and affect. Normal behavior. Normal judgment and thought content.   Did not bring log.  Fasting CBGs: 110-120 (100% out of range) PCB:  120-130 (~50% out of range) PCL:  120-130 (~50% out of range) PCD: 120-135 (~50% out of range)  Random CBG 150  Assessment  and Plan:  Pregnancy: G2P1001 at 2279w3d  1. Supervision of high risk pregnancy, antepartum  - CBC - RPR - HIV antibody - US MFM OB FOLLOW UP; Future - US FETAL BPP WO NON STRESS; Future  2. Preexisting diabetes complicating pregnancy, antepartum  - Hgb A1C - CBC - RPR - HIV antibody - US MFM OB FOLLOW UP; Future - US FETAL BPP WO NON STRESS; Future  3. Rh negative, antepartum  -Rhophylac  4. Uncontrolled type 2 diabetes mellitus with hyperglycemia (HCC)--Not taking both meds  - Start antenatal testing 2/2 uncontrolled DM likely 2/2 not taking meds as directed due to language barrier or possible poor comprehension. .  - US FETAL BPP WO NON STRESS; Future - POCT Glucose (CBG) - Hemoglobin A1c - Lengthy discussion about taking both Metformin and Glyburide.  - Offered repeat DM educator visit--refuses.  - Instructed to bring log to EVERY visit. Lengthy discussion about importance of testing blood sugars, bringing log book and keeping appointments. Discussed risks of uncontrolled DM in pregnancy including delayed fetal lung maturity, IUFD and shoulder dystocia possibly resulting brachial plexus palsy, brain damage, intrapartum death and extensive obstetric lacerations. Patient verbalizes understanding.    Preterm labor symptoms and general obstetric precautions including but not limited to vaginal bleeding, contractions, leaking of fluid and fetal movement were reviewed in detail with the patient. Please refer to After Visit Summary for other counseling recommendations.  Follow-up 1 week for ROB/NST.   Dorathy KinsmanVirginia Natalie Mceuen, CNM

## 2017-09-23 NOTE — Progress Notes (Signed)
Called Dr.Cotton's office to reschedule 2 d echo. She has no showed one appt, and cancelled one per Dr.Cotton's office. Rescheduled for 11/01/17

## 2017-09-23 NOTE — Progress Notes (Signed)
States taking cbg's but forgot to bring meter or log. Would like flu shot today . Elevated phq0 and gad7, declines to see behavioral health clinician.   Scheduled for 2 d echo for 11/01/17 0830 with Dr. Elizebeth Brookingotton

## 2017-09-24 LAB — CBC
Hematocrit: 38 % (ref 34.0–46.6)
Hemoglobin: 13.1 g/dL (ref 11.1–15.9)
MCH: 28.5 pg (ref 26.6–33.0)
MCHC: 34.5 g/dL (ref 31.5–35.7)
MCV: 83 fL (ref 79–97)
PLATELETS: 255 10*3/uL (ref 150–379)
RBC: 4.59 x10E6/uL (ref 3.77–5.28)
RDW: 14.3 % (ref 12.3–15.4)
WBC: 6.9 10*3/uL (ref 3.4–10.8)

## 2017-09-24 LAB — RPR: RPR Ser Ql: NONREACTIVE

## 2017-09-24 LAB — HEMOGLOBIN A1C
Est. average glucose Bld gHb Est-mCnc: 151 mg/dL
Hgb A1c MFr Bld: 6.9 % — ABNORMAL HIGH (ref 4.8–5.6)

## 2017-09-24 LAB — HIV ANTIBODY (ROUTINE TESTING W REFLEX): HIV Screen 4th Generation wRfx: NONREACTIVE

## 2017-09-24 LAB — ANTIBODY SCREEN: Antibody Screen: NEGATIVE

## 2017-09-28 ENCOUNTER — Ambulatory Visit (HOSPITAL_COMMUNITY)
Admission: RE | Admit: 2017-09-28 | Discharge: 2017-09-28 | Disposition: A | Discharge status: Home / Self Care | Payer: No Typology Code available for payment source | Source: Ambulatory Visit | Attending: Advanced Practice Midwife | Admitting: Advanced Practice Midwife

## 2017-09-28 ENCOUNTER — Other Ambulatory Visit: Payer: Self-pay | Admitting: Advanced Practice Midwife

## 2017-09-28 DIAGNOSIS — O0990 Supervision of high risk pregnancy, unspecified, unspecified trimester: Secondary | ICD-10-CM

## 2017-09-28 DIAGNOSIS — O0993 Supervision of high risk pregnancy, unspecified, third trimester: Secondary | ICD-10-CM | POA: Insufficient documentation

## 2017-09-28 DIAGNOSIS — Z6791 Unspecified blood type, Rh negative: Secondary | ICD-10-CM

## 2017-09-28 DIAGNOSIS — E1165 Type 2 diabetes mellitus with hyperglycemia: Secondary | ICD-10-CM

## 2017-09-28 DIAGNOSIS — O26899 Other specified pregnancy related conditions, unspecified trimester: Principal | ICD-10-CM

## 2017-09-28 DIAGNOSIS — O24319 Unspecified pre-existing diabetes mellitus in pregnancy, unspecified trimester: Secondary | ICD-10-CM

## 2017-09-28 DIAGNOSIS — Z362 Encounter for other antenatal screening follow-up: Secondary | ICD-10-CM | POA: Insufficient documentation

## 2017-09-28 DIAGNOSIS — Z3A31 31 weeks gestation of pregnancy: Secondary | ICD-10-CM | POA: Diagnosis not present

## 2017-09-28 DIAGNOSIS — O24113 Pre-existing diabetes mellitus, type 2, in pregnancy, third trimester: Secondary | ICD-10-CM | POA: Diagnosis not present

## 2017-09-28 DIAGNOSIS — O360130 Maternal care for anti-D [Rh] antibodies, third trimester, not applicable or unspecified: Secondary | ICD-10-CM | POA: Insufficient documentation

## 2017-09-29 ENCOUNTER — Telehealth: Payer: Self-pay | Admitting: *Deleted

## 2017-09-29 NOTE — Telephone Encounter (Signed)
Called pt with [employer] interpreter [phone] and left message regarding change in appointment. She will not need appt as scheduled on 11/2 (Pt had BPP 8/8 yesterday)  Her appt has been changed to 11/6 @ 1040.  She may call back if she has questions.

## 2017-09-30 ENCOUNTER — Other Ambulatory Visit: Payer: Self-pay

## 2017-09-30 ENCOUNTER — Encounter: Payer: Self-pay | Admitting: Family Medicine

## 2017-10-04 ENCOUNTER — Ambulatory Visit (INDEPENDENT_AMBULATORY_CARE_PROVIDER_SITE_OTHER): Payer: No Typology Code available for payment source | Admitting: Obstetrics and Gynecology

## 2017-10-04 ENCOUNTER — Ambulatory Visit: Payer: Self-pay

## 2017-10-04 ENCOUNTER — Ambulatory Visit (INDEPENDENT_AMBULATORY_CARE_PROVIDER_SITE_OTHER): Payer: No Typology Code available for payment source | Admitting: *Deleted

## 2017-10-04 ENCOUNTER — Encounter: Payer: Self-pay | Admitting: *Deleted

## 2017-10-04 VITALS — BP 121/75 | HR 107 | Wt 232.4 lb

## 2017-10-04 DIAGNOSIS — O24313 Unspecified pre-existing diabetes mellitus in pregnancy, third trimester: Secondary | ICD-10-CM

## 2017-10-04 DIAGNOSIS — O24319 Unspecified pre-existing diabetes mellitus in pregnancy, unspecified trimester: Secondary | ICD-10-CM

## 2017-10-04 DIAGNOSIS — O09893 Supervision of other high risk pregnancies, third trimester: Secondary | ICD-10-CM

## 2017-10-04 DIAGNOSIS — O0992 Supervision of high risk pregnancy, unspecified, second trimester: Secondary | ICD-10-CM

## 2017-10-04 DIAGNOSIS — O0993 Supervision of high risk pregnancy, unspecified, third trimester: Secondary | ICD-10-CM

## 2017-10-04 DIAGNOSIS — Z6791 Unspecified blood type, Rh negative: Secondary | ICD-10-CM

## 2017-10-04 DIAGNOSIS — O26893 Other specified pregnancy related conditions, third trimester: Secondary | ICD-10-CM

## 2017-10-04 LAB — POCT URINALYSIS DIP (DEVICE)
Bilirubin Urine: NEGATIVE
GLUCOSE, UA: 500 mg/dL — AB
HGB URINE DIPSTICK: NEGATIVE
Ketones, ur: 40 mg/dL — AB
NITRITE: NEGATIVE
PROTEIN: NEGATIVE mg/dL
UROBILINOGEN UA: 0.2 mg/dL (ref 0.0–1.0)
pH: 5 (ref 5.0–8.0)

## 2017-10-04 MED ORDER — PREPLUS 27-1 MG PO TABS: 1.0000 | ORAL_TABLET | Freq: Every day | ORAL | 13 refills | Status: DC

## 2017-10-04 NOTE — Progress Notes (Signed)
   PRENATAL VISIT NOTE  Subjective:  Kayla George is a 26 y.o. G2P1001 at 6062w0d being seen today for ongoing prenatal care.  She is currently monitored for the following issues for this high-risk pregnancy and has Preexisting diabetes complicating pregnancy, antepartum; Supervision of high-risk pregnancy; Rh negative status during pregnancy; and Candida vaginitis on their problem list.  Patient reports no complaints.  Contractions: Not present. Vag. Bleeding: None.  Movement: Present. Denies leaking of fluid.   The following portions of the patient's history were reviewed and updated as appropriate: allergies, current medications, past family history, past medical history, past social history, past surgical history and problem list. Problem list updated.  Objective:   Vitals:   10/04/17 1052  BP: 121/75  Pulse: (!) 107  Weight: 232 lb 6.4 oz (105.4 kg)    Fetal Status: Fetal Heart Rate (bpm): NST   Movement: Present     General:  Alert, oriented and cooperative. Patient is in no acute distress.  Skin: Skin is warm and dry. No rash noted.   Cardiovascular: Normal heart rate noted  Respiratory: Normal respiratory effort, no problems with respiration noted  Abdomen: Soft, gravid, appropriate for gestational age.  Pain/Pressure: Absent     Pelvic: Cervical exam deferred        Extremities: Normal range of motion.  Edema: None  Mental Status:  Normal mood and affect. Normal behavior. Normal judgment and thought content.   Assessment and Plan:  Pregnancy: G2P1001 at 5862w0d  1. Preexisting diabetes complicating pregnancy, antepartum CBGs reviewed and great majority elevated. Patient reports eating "healthy" without truly describing what that means Fasting today 109 as opposed to 130-140 and after breakfast 115 as opposed to 150-160 Reviewed importance to adhere to diet Discussed starting insulin at next visit if values remain elevated Continue glyburide and metformin - US MFM OB FOLLOW  UP; Future - US MFM FETAL BPP WO NON STRESS; Future  2. Rh negative status during pregnancy in third trimester S/p rhogam  3. Supervision of high risk pregnancy in third trimester Patient is doing well without complaints Dental referral provided - US MFM OB FOLLOW UP; Future - US MFM FETAL BPP WO NON STRESS; Future - Ambulatory referral to Dentistry  4. Supervision of high risk pregnancy in second trimester  - Prenatal Vit-Fe Fumarate-FA (PREPLUS) 27-1 MG TABS; Take 1 tablet daily by mouth.  Dispense: 30 tablet; Refill: 13  Preterm labor symptoms and general obstetric precautions including but not limited to vaginal bleeding, contractions, leaking of fluid and fetal movement were reviewed in detail with the patient. Please refer to After Visit Summary for other counseling recommendations.  Return in about 7 days (around 10/11/2017) for weekly NST/BPP and HOB.   Kayla AntiguaPeggy Theophilus Walz, MD

## 2017-10-04 NOTE — Progress Notes (Addendum)
Video interpreter Christen BameRonnie (Arabic) used for encounter.    Pt informed that the ultrasound is considered a limited OB ultrasound and is not intended to be a complete ultrasound exam.  Patient also informed that the ultrasound is not being completed with the intent of assessing for fetal or placental anomalies or any pelvic abnormalities.  Explained that the purpose of today's ultrasound is to assess for presentation, BPP and amniotic fluid volume.  Patient acknowledges the purpose of the exam and the limitations of the study.    Pt requested letter so that her mother can apply for Visa to come to the [place] in order to assist with care of baby. Letter initiated however could not be completed due to pt does not know the spelling of her mother's name. She will bring the information to her next visit.

## 2017-10-10 ENCOUNTER — Ambulatory Visit (INDEPENDENT_AMBULATORY_CARE_PROVIDER_SITE_OTHER): Payer: No Typology Code available for payment source | Admitting: Family Medicine

## 2017-10-10 ENCOUNTER — Encounter: Payer: Self-pay | Admitting: Obstetrics and Gynecology

## 2017-10-10 ENCOUNTER — Ambulatory Visit: Payer: Self-pay

## 2017-10-10 ENCOUNTER — Ambulatory Visit (INDEPENDENT_AMBULATORY_CARE_PROVIDER_SITE_OTHER): Payer: No Typology Code available for payment source | Admitting: *Deleted

## 2017-10-10 VITALS — BP 136/92 | HR 94 | Wt 237.0 lb

## 2017-10-10 DIAGNOSIS — O24313 Unspecified pre-existing diabetes mellitus in pregnancy, third trimester: Secondary | ICD-10-CM

## 2017-10-10 DIAGNOSIS — O26899 Other specified pregnancy related conditions, unspecified trimester: Secondary | ICD-10-CM

## 2017-10-10 DIAGNOSIS — Z23 Encounter for immunization: Secondary | ICD-10-CM

## 2017-10-10 DIAGNOSIS — O24319 Unspecified pre-existing diabetes mellitus in pregnancy, unspecified trimester: Secondary | ICD-10-CM

## 2017-10-10 DIAGNOSIS — Z6791 Unspecified blood type, Rh negative: Secondary | ICD-10-CM

## 2017-10-10 DIAGNOSIS — O0990 Supervision of high risk pregnancy, unspecified, unspecified trimester: Secondary | ICD-10-CM

## 2017-10-10 DIAGNOSIS — O0993 Supervision of high risk pregnancy, unspecified, third trimester: Secondary | ICD-10-CM

## 2017-10-10 DIAGNOSIS — E1165 Type 2 diabetes mellitus with hyperglycemia: Secondary | ICD-10-CM

## 2017-10-10 MED ORDER — METFORMIN HCL 500 MG PO TABS
1000.0000 mg | ORAL_TABLET | Freq: Two times a day (BID) | ORAL | 3 refills | Status: DC
Start: 2017-10-10 — End: 2017-11-02

## 2017-10-10 NOTE — Progress Notes (Signed)
Pt informed that the ultrasound is considered a limited OB ultrasound and is not intended to be a complete ultrasound exam.  Patient also informed that the ultrasound is not being completed with the intent of assessing for fetal or placental anomalies or any pelvic abnormalities.  Explained that the purpose of today's ultrasound is to assess for presentation, BPP and amniotic fluid volume.  Patient acknowledges the purpose of the exam and the limitations of the study.   BPP 6/10.  Results reviewed w/Dr. Adrian BlackwaterStinson. Plan for pt to have repeat BPP tomorrow.  Pt agreed to plan of care.

## 2017-10-10 NOTE — Progress Notes (Signed)
Subjective:  Kayla George is a 26 y.o. G2P1001 at 1856w6d being seen today for ongoing prenatal care.  She is currently monitored for the following issues for this high-risk pregnancy and has Preexisting diabetes complicating pregnancy, antepartum; Supervision of high-risk pregnancy; Rh negative status during pregnancy; and Candida vaginitis on their problem list.  DM2: Patient taking Metformin 500mg  BID, glyburide 5mg  BID.  Reports no hypoglycemic episodes.  Tolerating medication well. Only checked three days. Missing some values. Fasting: 108-145 2hr PP: 96-155  Patient reports no complaints.  Contractions: Not present. Vag. Bleeding: None.  Movement: Present. Denies leaking of fluid.   The following portions of the patient's history were reviewed and updated as appropriate: allergies, current medications, past family history, past medical history, past social history, past surgical history and problem list. Problem list updated.  Objective:   Vitals:   10/10/17 1313  BP: (!) 136/92  Pulse: 94  Weight: 237 lb (107.5 kg)    Fetal Status: Fetal Heart Rate (bpm): 148   Movement: Present     General:  Alert, oriented and cooperative. Patient is in no acute distress.  Skin: Skin is warm and dry. No rash noted.   Cardiovascular: Normal heart rate noted  Respiratory: Normal respiratory effort, no problems with respiration noted  Abdomen: Soft, gravid, appropriate for gestational age. Pain/Pressure: Absent     Pelvic: Vag. Bleeding: None     Cervical exam deferred        Extremities: Normal range of motion.  Edema: Trace  Mental Status: Normal mood and affect. Normal behavior. Normal judgment and thought content.   Urinalysis:      Assessment and Plan:  Pregnancy: G2P1001 at 5856w6d  1. Supervision of high risk pregnancy, antepartum   2. Preexisting diabetes complicating pregnancy, antepartum Increase metformin to 1000mg  BID, continue glyburide 5mg  BID. BPP/NST today US for fetal  growth on 11/21. Fetal echo 11/29  3. Rh negative, antepartum Had rhogam.  4. Need for Tdap vaccination - Tdap vaccine greater than or equal to 7yo IM   Preterm labor symptoms and general obstetric precautions including but not limited to vaginal bleeding, contractions, leaking of fluid and fetal movement were reviewed in detail with the patient. Please refer to After Visit Summary for other counseling recommendations.  No Follow-up on file.   Levie HeritageStinson, Nicki Gracy J, DO

## 2017-10-10 NOTE — Progress Notes (Signed)
Fetal Echo rescheduled for 10/27/17 0830.

## 2017-10-11 ENCOUNTER — Ambulatory Visit: Payer: Self-pay

## 2017-10-11 ENCOUNTER — Other Ambulatory Visit: Payer: Self-pay

## 2017-10-11 ENCOUNTER — Ambulatory Visit (INDEPENDENT_AMBULATORY_CARE_PROVIDER_SITE_OTHER): Payer: No Typology Code available for payment source | Admitting: *Deleted

## 2017-10-11 DIAGNOSIS — O24319 Unspecified pre-existing diabetes mellitus in pregnancy, unspecified trimester: Secondary | ICD-10-CM

## 2017-10-11 DIAGNOSIS — O24313 Unspecified pre-existing diabetes mellitus in pregnancy, third trimester: Secondary | ICD-10-CM

## 2017-10-11 NOTE — Progress Notes (Signed)

## 2017-10-11 NOTE — Progress Notes (Signed)
NST not reactive, but had 10x10 accels. Discussed with Dr. Adrian BlackwaterStinson and he reviewed NST and that BPP 8/8. May discharge patient and plan obfu/bpp in one week. Patient asked for appointments to be changed- called MFM and changed bpp from pm to am.

## 2017-10-19 ENCOUNTER — Ambulatory Visit: Payer: Self-pay

## 2017-10-19 ENCOUNTER — Ambulatory Visit (HOSPITAL_COMMUNITY): Admission: RE | Admit: 2017-10-19 | Payer: No Typology Code available for payment source | Source: Ambulatory Visit

## 2017-10-19 ENCOUNTER — Ambulatory Visit (INDEPENDENT_AMBULATORY_CARE_PROVIDER_SITE_OTHER): Payer: No Typology Code available for payment source | Admitting: Obstetrics and Gynecology

## 2017-10-19 ENCOUNTER — Encounter: Payer: Self-pay | Admitting: Obstetrics and Gynecology

## 2017-10-19 ENCOUNTER — Ambulatory Visit (HOSPITAL_COMMUNITY): Payer: Self-pay

## 2017-10-19 ENCOUNTER — Ambulatory Visit (INDEPENDENT_AMBULATORY_CARE_PROVIDER_SITE_OTHER): Payer: No Typology Code available for payment source | Admitting: *Deleted

## 2017-10-19 VITALS — BP 122/81 | HR 98 | Wt 238.1 lb

## 2017-10-19 DIAGNOSIS — O24313 Unspecified pre-existing diabetes mellitus in pregnancy, third trimester: Secondary | ICD-10-CM

## 2017-10-19 DIAGNOSIS — O24319 Unspecified pre-existing diabetes mellitus in pregnancy, unspecified trimester: Secondary | ICD-10-CM

## 2017-10-19 DIAGNOSIS — Z6791 Unspecified blood type, Rh negative: Secondary | ICD-10-CM

## 2017-10-19 DIAGNOSIS — O0993 Supervision of high risk pregnancy, unspecified, third trimester: Secondary | ICD-10-CM

## 2017-10-19 DIAGNOSIS — O26899 Other specified pregnancy related conditions, unspecified trimester: Secondary | ICD-10-CM

## 2017-10-19 DIAGNOSIS — O09893 Supervision of other high risk pregnancies, third trimester: Secondary | ICD-10-CM

## 2017-10-19 DIAGNOSIS — O0990 Supervision of high risk pregnancy, unspecified, unspecified trimester: Secondary | ICD-10-CM

## 2017-10-19 NOTE — Progress Notes (Signed)

## 2017-10-19 NOTE — Progress Notes (Addendum)
   PRENATAL VISIT NOTE  Subjective:  Larose KellsSayeda Stupka is a 26 y.o. G2P1001 at 4454w1d being seen today for ongoing prenatal care.  She is currently monitored for the following issues for this high-risk pregnancy and has Preexisting diabetes complicating pregnancy, antepartum; Supervision of high-risk pregnancy; Rh negative status during pregnancy; and Candida vaginitis on their problem list.  Patient reports no complaints.  Contractions: Not present. Vag. Bleeding: None.  Movement: Present. Denies leaking of fluid.    DM2: Patient taking Metformin 1000mg  BID, glyburide 5mg  BID.  FG: 108-145 PP: 96-155 Patient only has 3 days of blood sugars and reports what she has with her is from before she started metformin 1000 mg BID. Reports since she started the metformin, they are lower. Does not have recent BG.  The following portions of the patient's history were reviewed and updated as appropriate: allergies, current medications, past family history, past medical history, past social history, past surgical history and problem list. Problem list updated.  Objective:   Vitals:   10/19/17 1440  BP: 122/81  Pulse: 98  Weight: 238 lb 1.6 oz (108 kg)    Fetal Status: Fetal Heart Rate (bpm): NST   Movement: Present     General:  Alert, oriented and cooperative. Patient is in no acute distress.  Skin: Skin is warm and dry. No rash noted.   Cardiovascular: Normal heart rate noted  Respiratory: Normal respiratory effort, no problems with respiration noted  Abdomen: Soft, gravid, appropriate for gestational age.  Pain/Pressure: Absent     Pelvic: Cervical exam deferred        Extremities: Normal range of motion.  Edema: None  Mental Status:  Normal mood and affect. Normal behavior. Normal judgment and thought content.   Assessment and Plan:  Pregnancy: G2P1001 at 6554w1d  1. Preexisting diabetes complicating pregnancy, antepartum Fetal echo 11/29 Didn't attend growth today, rescheduled for next  week Does not have recent BG with her, encouraged her to continue to take current dose of DM meds and record BG Return 1 week for visit  Reactive NST today  2. Rh negative, antepartum S/p Rho gam  3. Supervision of high risk pregnancy, antepartum   Preterm labor symptoms and general obstetric precautions including but not limited to vaginal bleeding, contractions, leaking of fluid and fetal movement were reviewed in detail with the patient. Please refer to After Visit Summary for other counseling recommendations.  Return in about 9 days (around 10/28/2017) for if possible, NST @ 1000, HOB w/Ervin @ 0900 - has US @ 0745.   Conan BowensKelly M Kate Larock, MD

## 2017-10-19 NOTE — Progress Notes (Signed)
Pt DNKA today for US growth and BPP @ [facility].

## 2017-10-19 NOTE — Patient Instructions (Signed)
Contraception Choices Contraception (birth control) is the use of any methods or devices to prevent pregnancy. Below are some methods to help avoid pregnancy. Hormonal methods  Contraceptive implant. This is a thin, plastic tube containing progesterone hormone. It does not contain estrogen hormone. Your health care provider inserts the tube in the inner part of the upper arm. The tube can remain in place for up to 3 years. After 3 years, the implant must be removed. The implant prevents the ovaries from releasing an egg (ovulation), thickens the cervical mucus to prevent sperm from entering the uterus, and thins the lining of the inside of the uterus.  Progesterone-only injections. These injections are given every 3 months by your health care provider to prevent pregnancy. This synthetic progesterone hormone stops the ovaries from releasing eggs. It also thickens cervical mucus and changes the uterine lining. This makes it harder for sperm to survive in the uterus.  Birth control pills. These pills contain estrogen and progesterone hormone. They work by preventing the ovaries from releasing eggs (ovulation). They also cause the cervical mucus to thicken, preventing the sperm from entering the uterus. Birth control pills are prescribed by a health care provider.Birth control pills can also be used to treat heavy periods.  Minipill. This type of birth control pill contains only the progesterone hormone. They are taken every day of each month and must be prescribed by your health care provider.  Birth control patch. The patch contains hormones similar to those in birth control pills. It must be changed once a week and is prescribed by a health care provider.  Vaginal ring. The ring contains hormones similar to those in birth control pills. It is left in the vagina for 3 weeks, removed for 1 week, and then a new one is put back in place. The patient must be comfortable inserting and removing the ring from  the vagina.A health care provider's prescription is necessary.  Emergency contraception. Emergency contraceptives prevent pregnancy after unprotected sexual intercourse. This pill can be taken right after sex or up to 5 days after unprotected sex. It is most effective the sooner you take the pills after having sexual intercourse. Most emergency contraceptive pills are available without a prescription. Check with your pharmacist. Do not use emergency contraception as your only form of birth control. Barrier methods  Female condom. This is a thin sheath (latex or rubber) that is worn over the penis during sexual intercourse. It can be used with spermicide to increase effectiveness.  Female condom. This is a soft, loose-fitting sheath that is put into the vagina before sexual intercourse.  Diaphragm. This is a soft, latex, dome-shaped barrier that must be fitted by a health care provider. It is inserted into the vagina, along with a spermicidal jelly. It is inserted before intercourse. The diaphragm should be left in the vagina for 6 to 8 hours after intercourse.  Cervical cap. This is a round, soft, latex or plastic cup that fits over the cervix and must be fitted by a health care provider. The cap can be left in place for up to 48 hours after intercourse.  Sponge. This is a soft, circular piece of polyurethane foam. The sponge has spermicide in it. It is inserted into the vagina after wetting it and before sexual intercourse.  Spermicides. These are chemicals that kill or block sperm from entering the cervix and uterus. They come in the form of creams, jellies, suppositories, foam, or tablets. They do not require a prescription. They   are inserted into the vagina with an applicator before having sexual intercourse. The process must be repeated every time you have sexual intercourse. Intrauterine contraception  Intrauterine device (IUD). This is a T-shaped device that is put in a woman's uterus during  a menstrual period to prevent pregnancy. There are 2 types: ? Copper IUD. This type of IUD is wrapped in copper wire and is placed inside the uterus. Copper makes the uterus and fallopian tubes produce a fluid that kills sperm. It can stay in place for 10 years. ? Hormone IUD. This type of IUD contains the hormone progestin (synthetic progesterone). The hormone thickens the cervical mucus and prevents sperm from entering the uterus, and it also thins the uterine lining to prevent implantation of a fertilized egg. The hormone can weaken or kill the sperm that get into the uterus. It can stay in place for 3-5 years, depending on which type of IUD is used. Permanent methods of contraception  Female tubal ligation. This is when the woman's fallopian tubes are surgically sealed, tied, or blocked to prevent the egg from traveling to the uterus.  Hysteroscopic sterilization. This involves placing a small coil or insert into each fallopian tube. Your doctor uses a technique called hysteroscopy to do the procedure. The device causes scar tissue to form. This results in permanent blockage of the fallopian tubes, so the sperm cannot fertilize the egg. It takes about 3 months after the procedure for the tubes to become blocked. You must use another form of birth control for these 3 months.  Female sterilization. This is when the female has the tubes that carry sperm tied off (vasectomy).This blocks sperm from entering the vagina during sexual intercourse. After the procedure, the man can still ejaculate fluid (semen). Natural planning methods  Natural family planning. This is not having sexual intercourse or using a barrier method (condom, diaphragm, cervical cap) on days the woman could become pregnant.  Calendar method. This is keeping track of the length of each menstrual cycle and identifying when you are fertile.  Ovulation method. This is avoiding sexual intercourse during ovulation.  Symptothermal method.  This is avoiding sexual intercourse during ovulation, using a thermometer and ovulation symptoms.  Post-ovulation method. This is timing sexual intercourse after you have ovulated. Regardless of which type or method of contraception you choose, it is important that you use condoms to protect against the transmission of sexually transmitted infections (STIs). Talk with your health care provider about which form of contraception is most appropriate for you. This information is not intended to replace advice given to you by your health care provider. Make sure you discuss any questions you have with your health care provider. Document Released: 11/15/2005 Document Revised: 04/22/2016 Document Reviewed: 05/10/2013 Elsevier Interactive Patient Education  2017 Elsevier Inc.  

## 2017-10-28 ENCOUNTER — Encounter: Payer: Self-pay | Admitting: Obstetrics and Gynecology

## 2017-10-28 ENCOUNTER — Other Ambulatory Visit: Payer: Self-pay | Admitting: Obstetrics and Gynecology

## 2017-10-28 ENCOUNTER — Ambulatory Visit (INDEPENDENT_AMBULATORY_CARE_PROVIDER_SITE_OTHER): Payer: PRIVATE HEALTH INSURANCE | Admitting: Obstetrics and Gynecology

## 2017-10-28 ENCOUNTER — Ambulatory Visit (HOSPITAL_COMMUNITY)
Admission: RE | Admit: 2017-10-28 | Discharge: 2017-10-28 | Disposition: A | Discharge status: Home / Self Care | Payer: PRIVATE HEALTH INSURANCE | Source: Ambulatory Visit | Attending: Obstetrics and Gynecology | Admitting: Obstetrics and Gynecology

## 2017-10-28 ENCOUNTER — Encounter (HOSPITAL_COMMUNITY): Payer: Self-pay

## 2017-10-28 ENCOUNTER — Ambulatory Visit (INDEPENDENT_AMBULATORY_CARE_PROVIDER_SITE_OTHER): Payer: PRIVATE HEALTH INSURANCE | Admitting: *Deleted

## 2017-10-28 VITALS — BP 125/91 | HR 99 | Wt 235.0 lb

## 2017-10-28 DIAGNOSIS — O09893 Supervision of other high risk pregnancies, third trimester: Secondary | ICD-10-CM

## 2017-10-28 DIAGNOSIS — O0990 Supervision of high risk pregnancy, unspecified, unspecified trimester: Secondary | ICD-10-CM

## 2017-10-28 DIAGNOSIS — O360130 Maternal care for anti-D [Rh] antibodies, third trimester, not applicable or unspecified: Secondary | ICD-10-CM | POA: Diagnosis not present

## 2017-10-28 DIAGNOSIS — O24319 Unspecified pre-existing diabetes mellitus in pregnancy, unspecified trimester: Secondary | ICD-10-CM

## 2017-10-28 DIAGNOSIS — Z362 Encounter for other antenatal screening follow-up: Secondary | ICD-10-CM | POA: Diagnosis not present

## 2017-10-28 DIAGNOSIS — Z3A35 35 weeks gestation of pregnancy: Secondary | ICD-10-CM

## 2017-10-28 DIAGNOSIS — Z6791 Unspecified blood type, Rh negative: Secondary | ICD-10-CM

## 2017-10-28 DIAGNOSIS — O0993 Supervision of high risk pregnancy, unspecified, third trimester: Secondary | ICD-10-CM

## 2017-10-28 DIAGNOSIS — O24313 Unspecified pre-existing diabetes mellitus in pregnancy, third trimester: Secondary | ICD-10-CM

## 2017-10-28 DIAGNOSIS — Z3689 Encounter for other specified antenatal screening: Secondary | ICD-10-CM | POA: Insufficient documentation

## 2017-10-28 DIAGNOSIS — O403XX0 Polyhydramnios, third trimester, not applicable or unspecified: Secondary | ICD-10-CM | POA: Insufficient documentation

## 2017-10-28 DIAGNOSIS — O24113 Pre-existing diabetes mellitus, type 2, in pregnancy, third trimester: Secondary | ICD-10-CM | POA: Insufficient documentation

## 2017-10-28 DIAGNOSIS — Z113 Encounter for screening for infections with a predominantly sexual mode of transmission: Secondary | ICD-10-CM

## 2017-10-28 DIAGNOSIS — O26893 Other specified pregnancy related conditions, third trimester: Secondary | ICD-10-CM

## 2017-10-28 MED ORDER — RANITIDINE HCL 150 MG PO TABS: 150.0000 mg | ORAL_TABLET | Freq: Two times a day (BID) | ORAL | 1 refills | Status: DC

## 2017-10-28 NOTE — Progress Notes (Signed)
Subjective:  Kayla George is a 26 y.o. G2P1001 at 6256w3d being seen today for ongoing prenatal care.  She is currently monitored for the following issues for this high-risk pregnancy and has Preexisting diabetes complicating pregnancy, antepartum; Supervision of high-risk pregnancy; and Rh negative status during pregnancy on their problem list.  Patient reports heartburn.  Contractions: Not present. Vag. Bleeding: None.  Movement: Present. Denies leaking of fluid.   The following portions of the patient's history were reviewed and updated as appropriate: allergies, current medications, past family history, past medical history, past social history, past surgical history and problem list. Problem list updated.  Objective:   Vitals:   10/28/17 0923  BP: (!) 125/91  Pulse: 99  Weight: 235 lb (106.6 kg)    Fetal Status: Fetal Heart Rate (bpm): 139   Movement: Present     General:  Alert, oriented and cooperative. Patient is in no acute distress.  Skin: Skin is warm and dry. No rash noted.   Cardiovascular: Normal heart rate noted  Respiratory: Normal respiratory effort, no problems with respiration noted  Abdomen: Soft, gravid, appropriate for gestational age. Pain/Pressure: Absent     Pelvic:  Cervical exam performed        Extremities: Normal range of motion.  Edema: None  Mental Status: Normal mood and affect. Normal behavior. Normal judgment and thought content.   Urinalysis:      Assessment and Plan:  Pregnancy: G2P1001 at 856w3d  1. Supervision of high risk pregnancy, antepartum Stable  - GC[facility]Chlam[facility] - Culture, beta strep (group b only) - ranitidine (ZANTAC) 150 MG tablet; Take 1 tablet (150 mg total) by mouth 2 (two) times daily.  Dispense: 60 tablet; Refill: 1  2. Rh negative status during pregnancy in third trimester S/P rhogam  3. Preexisting diabetes complicating pregnancy, antepartum BS in goal range U/S > 90 % growth, BPP 8/8 NST  today Continue with weekly antenatal testing  Interrupter used during today's visit  Preterm labor symptoms and general obstetric precautions including but not limited to vaginal bleeding, contractions, leaking of fluid and fetal movement were reviewed in detail with the patient. Please refer to After Visit Summary for other counseling recommendations.  Return in about 1 week (around 11/04/2017) for OB visit.   Hermina StaggersErvin, Naquita Nappier L, MD

## 2017-10-31 LAB — GC/CHLAMYDIA PROBE AMP (~~LOC~~) NOT AT ARMC
CHLAMYDIA, DNA PROBE: NEGATIVE
NEISSERIA GONORRHEA: NEGATIVE

## 2017-11-01 ENCOUNTER — Ambulatory Visit: Payer: Self-pay

## 2017-11-01 ENCOUNTER — Encounter: Payer: Self-pay | Admitting: *Deleted

## 2017-11-01 ENCOUNTER — Ambulatory Visit (INDEPENDENT_AMBULATORY_CARE_PROVIDER_SITE_OTHER): Payer: PRIVATE HEALTH INSURANCE | Admitting: *Deleted

## 2017-11-01 DIAGNOSIS — O24313 Unspecified pre-existing diabetes mellitus in pregnancy, third trimester: Secondary | ICD-10-CM | POA: Diagnosis not present

## 2017-11-01 DIAGNOSIS — O24319 Unspecified pre-existing diabetes mellitus in pregnancy, unspecified trimester: Secondary | ICD-10-CM

## 2017-11-01 NOTE — Progress Notes (Signed)
Pt informed that the ultrasound is considered a limited OB ultrasound and is not intended to be a complete ultrasound exam.  Patient also informed that the ultrasound is not being completed with the intent of assessing for fetal or placental anomalies or any pelvic abnormalities.  Explained that the purpose of today's ultrasound is to assess for presentation, BPP and amniotic fluid volume.  Patient acknowledges the purpose of the exam and the limitations of the study.    Video interpreter Dois DavenportSandra [phone]#[id number] used for encounter

## 2017-11-01 NOTE — Progress Notes (Signed)
Nonreactive NST, BPP 8/10. Continue recommended antenatal testing and prenatal care.

## 2017-11-02 ENCOUNTER — Ambulatory Visit (INDEPENDENT_AMBULATORY_CARE_PROVIDER_SITE_OTHER): Payer: PRIVATE HEALTH INSURANCE | Admitting: Advanced Practice Midwife

## 2017-11-02 VITALS — BP 132/88 | HR 100 | Wt 233.0 lb

## 2017-11-02 DIAGNOSIS — O0993 Supervision of high risk pregnancy, unspecified, third trimester: Secondary | ICD-10-CM

## 2017-11-02 DIAGNOSIS — O26899 Other specified pregnancy related conditions, unspecified trimester: Secondary | ICD-10-CM

## 2017-11-02 DIAGNOSIS — O163 Unspecified maternal hypertension, third trimester: Secondary | ICD-10-CM

## 2017-11-02 DIAGNOSIS — Z6791 Unspecified blood type, Rh negative: Secondary | ICD-10-CM

## 2017-11-02 DIAGNOSIS — O24313 Unspecified pre-existing diabetes mellitus in pregnancy, third trimester: Secondary | ICD-10-CM

## 2017-11-02 DIAGNOSIS — O139 Gestational [pregnancy-induced] hypertension without significant proteinuria, unspecified trimester: Secondary | ICD-10-CM | POA: Insufficient documentation

## 2017-11-02 DIAGNOSIS — E1165 Type 2 diabetes mellitus with hyperglycemia: Secondary | ICD-10-CM

## 2017-11-02 DIAGNOSIS — O24319 Unspecified pre-existing diabetes mellitus in pregnancy, unspecified trimester: Secondary | ICD-10-CM

## 2017-11-02 DIAGNOSIS — O26893 Other specified pregnancy related conditions, third trimester: Secondary | ICD-10-CM

## 2017-11-02 DIAGNOSIS — O0990 Supervision of high risk pregnancy, unspecified, unspecified trimester: Secondary | ICD-10-CM

## 2017-11-02 DIAGNOSIS — O09893 Supervision of other high risk pregnancies, third trimester: Secondary | ICD-10-CM

## 2017-11-02 LAB — POCT URINALYSIS DIP (DEVICE)
Bilirubin Urine: NEGATIVE
Glucose, UA: NEGATIVE mg/dL
HGB URINE DIPSTICK: NEGATIVE
Ketones, ur: NEGATIVE mg/dL
Leukocytes, UA: NEGATIVE
NITRITE: NEGATIVE
PH: 5.5 (ref 5.0–8.0)
PROTEIN: NEGATIVE mg/dL
SPECIFIC GRAVITY, URINE: 1.025 (ref 1.005–1.030)
UROBILINOGEN UA: 0.2 mg/dL (ref 0.0–1.0)

## 2017-11-02 LAB — CULTURE, BETA STREP (GROUP B ONLY): Strep Gp B Culture: NEGATIVE

## 2017-11-02 MED ORDER — GLYBURIDE 5 MG PO TABS: 5.0000 mg | ORAL_TABLET | Freq: Two times a day (BID) | ORAL | 3 refills | Status: DC

## 2017-11-02 MED ORDER — METFORMIN HCL 500 MG PO TABS: ORAL_TABLET | ORAL | 3 refills | Status: DC

## 2017-11-02 NOTE — Patient Instructions (Signed)
metFORMIN (GLUCOPHAGE) 500 MG tablet; 500 mg (1 tablet) in morning and 1000 mg (2 tablets) at bedtime   Hypertension During Pregnancy Hypertension, commonly called high blood pressure, is when the force of blood pumping through your arteries is too strong. Arteries are blood vessels that carry blood from the heart throughout the body. Hypertension during pregnancy can cause problems for you and your baby. Your baby may be born early (prematurely) or may not weigh as much as he or she should at birth. Very bad cases of hypertension during pregnancy can be life-threatening. Different types of hypertension can occur during pregnancy. These include:  Chronic hypertension. This happens when: ? You have hypertension before pregnancy and it continues during pregnancy. ? You develop hypertension before you are [redacted] weeks pregnant, and it continues during pregnancy.  Gestational hypertension. This is hypertension that develops after the 20th week of pregnancy.  Preeclampsia, also called toxemia of pregnancy. This is a very serious type of hypertension that develops only during pregnancy. It affects the whole body, and it can be very dangerous for you and your baby.  Gestational hypertension and preeclampsia usually go away within 6 weeks after your baby is born. Women who have hypertension during pregnancy have a greater chance of developing hypertension later in life or during future pregnancies. What are the causes? The exact cause of hypertension is not known. What increases the risk? There are certain factors that make it more likely for you to develop hypertension during pregnancy. These include:  Having hypertension during a previous pregnancy or prior to pregnancy.  Being overweight.  Being older than age 25.  Being pregnant for the first time or being pregnant with more than one baby.  Becoming pregnant using fertilization methods such as IVF (in vitro fertilization).  Having diabetes,  kidney problems, or systemic lupus erythematosus.  Having a family history of hypertension.  What are the signs or symptoms? Chronic hypertension and gestational hypertension rarely cause symptoms. Preeclampsia causes symptoms, which may include:  Increased protein in your urine. Your health care provider will check for this at every visit before you give birth (prenatal visit).  Severe headaches.  Sudden weight gain.  Swelling of the hands, face, legs, and feet.  Nausea and vomiting.  Vision problems, such as blurred or double vision.  Numbness in the face, arms, legs, and feet.  Dizziness.  Slurred speech.  Sensitivity to bright lights.  Abdominal pain.  Convulsions.  How is this diagnosed? You may be diagnosed with hypertension during a routine prenatal exam. At each prenatal visit, you may:  Have a urine test to check for high amounts of protein in your urine.  Have your blood pressure checked. A blood pressure reading is recorded as two numbers, such as "120 over 80" (or 120/80). The first ("top") number is called the systolic pressure. It is a measure of the pressure in your arteries when your heart beats. The second ("bottom") number is called the diastolic pressure. It is a measure of the pressure in your arteries as your heart relaxes between beats. Blood pressure is measured in a unit called mm Hg. A normal blood pressure reading is: ? Systolic: below 120. ? Diastolic: below 80.  The type of hypertension that you are diagnosed with depends on your test results and when your symptoms developed.  Chronic hypertension is usually diagnosed before 20 weeks of pregnancy.  Gestational hypertension is usually diagnosed after 20 weeks of pregnancy.  Hypertension with high amounts of protein in  the urine is diagnosed as preeclampsia.  Blood pressure measurements that stay above 160 systolic, or above 110 diastolic, are signs of severe preeclampsia.  How is this  treated? Treatment for hypertension during pregnancy varies depending on the type of hypertension you have and how serious it is.  If you take medicines called ACE inhibitors to treat chronic hypertension, you may need to switch medicines. ACE inhibitors should not be taken during pregnancy.  If you have gestational hypertension, you may need to take blood pressure medicine.  If you are at risk for preeclampsia, your health care provider may recommend that you take a low-dose aspirin every day to prevent high blood pressure during your pregnancy.  If you have severe preeclampsia, you may need to be hospitalized so you and your baby can be monitored closely. You may also need to take medicine (magnesium sulfate) to prevent seizures and to lower blood pressure. This medicine may be given as an injection or through an IV tube.  In some cases, if your condition gets worse, you may need to deliver your baby early.  Follow these instructions at home: Eating and drinking  Drink enough fluid to keep your urine clear or pale yellow.  Eat a healthy diet that is low in salt (sodium). Do not add salt to your food. Check food labels to see how much sodium a food or beverage contains. Lifestyle  Do not use any products that contain nicotine or tobacco, such as cigarettes and e-cigarettes. If you need help quitting, ask your health care provider.  Do not use alcohol.  Avoid caffeine.  Avoid stress as much as possible. Rest and get plenty of sleep. General instructions  Take over-the-counter and prescription medicines only as told by your health care provider.  While lying down, lie on your left side. This keeps pressure off your baby.  While sitting or lying down, raise (elevate) your feet. Try putting some pillows under your lower legs.  Exercise regularly. Ask your health care provider what kinds of exercise are best for you.  Keep all prenatal and follow-up visits as told by your health  care provider. This is important. Contact a health care provider if:  You have symptoms that your health care provider told you may require more treatment or monitoring, such as: ? Fever. ? Vomiting. ? Headache. Get help right away if:  You have severe abdominal pain or vomiting that does not get better with treatment.  You suddenly develop swelling in your hands, ankles, or face.  You gain 4 lbs (1.8 kg) or more in 1 week.  You develop vaginal bleeding, or you have blood in your urine.  You do not feel your baby moving as much as usual.  You have blurred or double vision.  You have muscle twitching or sudden tightening (spasms).  You have shortness of breath.  Your lips or fingernails turn blue. This information is not intended to replace advice given to you by your health care provider. Make sure you discuss any questions you have with your health care provider. Document Released: 08/03/2011 Document Revised: 06/04/2016 Document Reviewed: 04/30/2016 Elsevier Interactive Patient Education  [employer]2018 Elsevier Inc.

## 2017-11-02 NOTE — Progress Notes (Signed)
   PRENATAL VISIT NOTE  Subjective:  Kayla George is a 26 y.o. G2P1001 at 6342w1d being seen today for ongoing prenatal care.  She is currently monitored for the following issues for this high-risk pregnancy and has Preexisting diabetes complicating pregnancy, antepartum; Supervision of high-risk pregnancy; Rh negative status during pregnancy; and Gestational hypertension, antepartum on their problem list.  Patient reports no complaints.  Denies HA, vision changes or epigastric pain.  Contractions: Not present. Vag. Bleeding: None.  Movement: Present. Denies leaking of fluid.   The following portions of the patient's history were reviewed and updated as appropriate: allergies, current medications, past family history, past medical history, past social history, past surgical history and problem list. Problem list updated.  Video interpreter used.  Objective:   Vitals:   11/02/17 1214 11/02/17 1219  BP: (!) 129/91 132/88  Pulse: 100   Weight: 233 lb (105.7 kg)     Fetal Status: Fetal Heart Rate (bpm): 140   Movement: Present     General:  Alert, oriented and cooperative. Patient is in no acute distress.  Skin: Skin is warm and dry. No rash noted.   Cardiovascular: Normal heart rate noted  Respiratory: Normal respiratory effort, no problems with respiration noted  Abdomen: Soft, gravid, appropriate for gestational age.  Pain/Pressure: Absent     Pelvic: Cervical exam deferred        Extremities: Normal range of motion.  Edema: None  Mental Status:  Normal mood and affect. Normal behavior. Normal judgment and thought content.   Assessment and Plan:  Pregnancy: G2P1001 at 7342w1d  1. Preexisting diabetes complicating pregnancy, antepartum. > 50% fasting CBGs elevated   - Increase Metformin at HS. - glyBURIDE (DIABETA) 5 MG tablet; Take 1 tablet (5 mg total) by mouth 2 (two) times daily with a meal.  Dispense: 60 tablet; Refill: 3 - metFORMIN (GLUCOPHAGE) 500 MG tablet; 500 mg (1 tablet)  in morning and 1000 mg (2 tablets) at bedtime.  Dispense: 120 tablet; Refill: 3  2. Rh negative status during pregnancy in third trimester   3. Supervision of high risk pregnancy in third trimester   4. Elevated blood pressure affecting pregnancy in third trimester, antepartum. Had third mildly elevated BP today. No Pre-E Sx.   - Recommend IOL at 37  Weeks--Refuses despite discussion of risks associated w/ GHTN/Pre-E. Will bring pt back Monday 12/10 (Can't come 12/7) for BP check and discuss IOL again. If labs C/W Pre-E w/ severe features will call pt and recommend IOL at that time.  - Pre-E labs  Term labor symptoms and general obstetric precautions including but not limited to vaginal bleeding, contractions, leaking of fluid and fetal movement were reviewed in detail with the patient. Please refer to After Visit Summary for other counseling recommendations.  Return in about 5 days (around 11/07/2017) for ROB/BP check.   Dorathy KinsmanVirginia Tynisa Vohs, CNM

## 2017-11-03 LAB — COMPREHENSIVE METABOLIC PANEL
A/G RATIO: 1 — AB (ref 1.2–2.2)
ALT: 19 IU/L (ref 0–32)
AST: 14 IU/L (ref 0–40)
Albumin: 3.5 g/dL (ref 3.5–5.5)
Alkaline Phosphatase: 164 IU/L — ABNORMAL HIGH (ref 39–117)
BUN/Creatinine Ratio: 10 (ref 9–23)
BUN: 5 mg/dL — AB (ref 6–20)
Bilirubin Total: 0.4 mg/dL (ref 0.0–1.2)
CALCIUM: 9.7 mg/dL (ref 8.7–10.2)
CHLORIDE: 103 mmol/L (ref 96–106)
CO2: 22 mmol/L (ref 20–29)
Creatinine, Ser: 0.49 mg/dL — ABNORMAL LOW (ref 0.57–1.00)
GFR calc non Af Amer: 136 mL/min/{1.73_m2} (ref >59)
GFR, EST AFRICAN AMERICAN: 157 mL/min/{1.73_m2} (ref >59)
GLUCOSE: 149 mg/dL — AB (ref 65–99)
Globulin, Total: 3.5 g/dL (ref 1.5–4.5)
POTASSIUM: 4.3 mmol/L (ref 3.5–5.2)
Sodium: 139 mmol/L (ref 134–144)
TOTAL PROTEIN: 7 g/dL (ref 6.0–8.5)

## 2017-11-03 LAB — PROTEIN / CREATININE RATIO, URINE
Creatinine, Urine: 123.5 mg/dL
PROTEIN UR: 15.7 mg/dL
PROTEIN/CREAT RATIO: 127 mg/g{creat} (ref 0–200)

## 2017-11-07 ENCOUNTER — Encounter: Payer: Self-pay | Admitting: Obstetrics & Gynecology

## 2017-11-09 ENCOUNTER — Encounter: Payer: Self-pay | Admitting: Obstetrics and Gynecology

## 2017-11-09 ENCOUNTER — Encounter (HOSPITAL_COMMUNITY): Payer: Self-pay

## 2017-11-09 ENCOUNTER — Other Ambulatory Visit: Payer: Self-pay

## 2017-11-09 ENCOUNTER — Inpatient Hospital Stay (HOSPITAL_COMMUNITY)
Admission: AD | Admit: 2017-11-09 | Discharge: 2017-11-11 | DRG: 805 | Disposition: A | Discharge status: Home / Self Care | Payer: PRIVATE HEALTH INSURANCE | Source: Ambulatory Visit | Attending: Family Medicine | Admitting: Family Medicine

## 2017-11-09 DIAGNOSIS — O4292 Full-term premature rupture of membranes, unspecified as to length of time between rupture and onset of labor: Secondary | ICD-10-CM | POA: Diagnosis present

## 2017-11-09 DIAGNOSIS — Z7984 Long term (current) use of oral hypoglycemic drugs: Secondary | ICD-10-CM

## 2017-11-09 DIAGNOSIS — Z3A37 37 weeks gestation of pregnancy: Secondary | ICD-10-CM

## 2017-11-09 DIAGNOSIS — E119 Type 2 diabetes mellitus without complications: Secondary | ICD-10-CM | POA: Diagnosis present

## 2017-11-09 DIAGNOSIS — O134 Gestational [pregnancy-induced] hypertension without significant proteinuria, complicating childbirth: Secondary | ICD-10-CM | POA: Diagnosis present

## 2017-11-09 DIAGNOSIS — O0993 Supervision of high risk pregnancy, unspecified, third trimester: Secondary | ICD-10-CM

## 2017-11-09 DIAGNOSIS — O26893 Other specified pregnancy related conditions, third trimester: Secondary | ICD-10-CM | POA: Diagnosis present

## 2017-11-09 DIAGNOSIS — O24319 Unspecified pre-existing diabetes mellitus in pregnancy, unspecified trimester: Secondary | ICD-10-CM

## 2017-11-09 DIAGNOSIS — Z6791 Unspecified blood type, Rh negative: Secondary | ICD-10-CM | POA: Diagnosis not present

## 2017-11-09 DIAGNOSIS — O2412 Pre-existing diabetes mellitus, type 2, in childbirth: Secondary | ICD-10-CM | POA: Diagnosis present

## 2017-11-09 DIAGNOSIS — O139 Gestational [pregnancy-induced] hypertension without significant proteinuria, unspecified trimester: Secondary | ICD-10-CM

## 2017-11-09 LAB — CBC
HCT: 40.8 % (ref 36.0–46.0)
Hemoglobin: 13.5 g/dL (ref 12.0–15.0)
MCH: 28.2 pg (ref 26.0–34.0)
MCHC: 33.1 g/dL (ref 30.0–36.0)
MCV: 85.4 fL (ref 78.0–100.0)
PLATELETS: 239 10*3/uL (ref 150–400)
RBC: 4.78 MIL/uL (ref 3.87–5.11)
RDW: 14.3 % (ref 11.5–15.5)
WBC: 7.5 10*3/uL (ref 4.0–10.5)

## 2017-11-09 LAB — COMPREHENSIVE METABOLIC PANEL
ALK PHOS: 178 U/L — AB (ref 38–126)
ALT: 15 U/L (ref 14–54)
AST: 23 U/L (ref 15–41)
Albumin: 3 g/dL — ABNORMAL LOW (ref 3.5–5.0)
Anion gap: 9 (ref 5–15)
BILIRUBIN TOTAL: 0.7 mg/dL (ref 0.3–1.2)
BUN: 7 mg/dL (ref 6–20)
CALCIUM: 9.4 mg/dL (ref 8.9–10.3)
CHLORIDE: 106 mmol/L (ref 101–111)
CO2: 20 mmol/L — ABNORMAL LOW (ref 22–32)
Creatinine, Ser: 0.54 mg/dL (ref 0.44–1.00)
GFR calc Af Amer: >60 mL/min (ref >60)
Glucose, Bld: 105 mg/dL — ABNORMAL HIGH (ref 65–99)
Potassium: 4.2 mmol/L (ref 3.5–5.1)
Sodium: 135 mmol/L (ref 135–145)
Total Protein: 6.9 g/dL (ref 6.5–8.1)

## 2017-11-09 LAB — TYPE AND SCREEN
ABO/RH(D): B NEG
Antibody Screen: NEGATIVE

## 2017-11-09 LAB — GLUCOSE, CAPILLARY
GLUCOSE-CAPILLARY: 92 mg/dL (ref 65–99)
Glucose-Capillary: 100 mg/dL — ABNORMAL HIGH (ref 65–99)

## 2017-11-09 LAB — POCT FERN TEST: POCT Fern Test: POSITIVE

## 2017-11-09 MED ORDER — FLEET ENEMA 7-19 GM/118ML RE ENEM: 1.0000 | ENEMA | RECTAL | Status: DC | PRN

## 2017-11-09 MED ORDER — LACTATED RINGERS IV SOLN
INTRAVENOUS | Status: DC
  Administered 2017-11-09: 10:00:00 via INTRAVENOUS

## 2017-11-09 MED ORDER — LACTATED RINGERS IV SOLN: 500.0000 mL | INTRAVENOUS | Status: DC | PRN

## 2017-11-09 MED ORDER — TETANUS-DIPHTH-ACELL PERTUSSIS 5-2.5-18.5 LF-MCG/0.5 IM SUSP: 0.5000 mL | Freq: Once | INTRAMUSCULAR | Status: DC

## 2017-11-09 MED ORDER — OXYTOCIN 10 UNIT/ML IJ SOLN
INTRAMUSCULAR | Status: AC
  Filled 2017-11-09: qty 1

## 2017-11-09 MED ORDER — PRENATAL MULTIVITAMIN CH
1.0000 | ORAL_TABLET | Freq: Every day | ORAL | Status: DC
  Administered 2017-11-10 – 2017-11-11 (×2): 1 via ORAL
  Filled 2017-11-09 (×2): qty 1

## 2017-11-09 MED ORDER — SOD CITRATE-CITRIC ACID 500-334 MG/5ML PO SOLN: 30.0000 mL | ORAL | Status: DC | PRN

## 2017-11-09 MED ORDER — ACETAMINOPHEN 325 MG PO TABS: 650.0000 mg | ORAL_TABLET | ORAL | Status: DC | PRN

## 2017-11-09 MED ORDER — BENZOCAINE-MENTHOL 20-0.5 % EX AERO
1.0000 | INHALATION_SPRAY | CUTANEOUS | Status: DC | PRN
Start: 2017-11-09 — End: 2017-11-11

## 2017-11-09 MED ORDER — DIPHENHYDRAMINE HCL 25 MG PO CAPS: 25.0000 mg | ORAL_CAPSULE | Freq: Four times a day (QID) | ORAL | Status: DC | PRN

## 2017-11-09 MED ORDER — OXYTOCIN BOLUS FROM INFUSION: 500.0000 mL | Freq: Once | INTRAVENOUS | Status: DC

## 2017-11-09 MED ORDER — OXYCODONE-ACETAMINOPHEN 5-325 MG PO TABS: 1.0000 | ORAL_TABLET | ORAL | Status: DC | PRN

## 2017-11-09 MED ORDER — COCONUT OIL OIL: 1.0000 "application " | TOPICAL_OIL | Status: DC | PRN

## 2017-11-09 MED ORDER — ONDANSETRON HCL 4 MG/2ML IJ SOLN: 4.0000 mg | Freq: Four times a day (QID) | INTRAMUSCULAR | Status: DC | PRN

## 2017-11-09 MED ORDER — OXYTOCIN 40 UNITS IN LACTATED RINGERS INFUSION - SIMPLE MED
2.5000 [IU]/h | INTRAVENOUS | Status: DC
  Filled 2017-11-09: qty 1000

## 2017-11-09 MED ORDER — OXYCODONE-ACETAMINOPHEN 5-325 MG PO TABS: 2.0000 | ORAL_TABLET | ORAL | Status: DC | PRN

## 2017-11-09 MED ORDER — LIDOCAINE HCL (PF) 1 % IJ SOLN
30.0000 mL | INTRAMUSCULAR | Status: AC | PRN
  Administered 2017-11-09: 30 mL via SUBCUTANEOUS
  Filled 2017-11-09: qty 30

## 2017-11-09 MED ORDER — ZOLPIDEM TARTRATE 5 MG PO TABS: 5.0000 mg | ORAL_TABLET | Freq: Every evening | ORAL | Status: DC | PRN

## 2017-11-09 MED ORDER — TERBUTALINE SULFATE 1 MG/ML IJ SOLN
0.2500 mg | Freq: Once | INTRAMUSCULAR | Status: DC | PRN
  Filled 2017-11-09: qty 1

## 2017-11-09 MED ORDER — ACETAMINOPHEN 325 MG PO TABS
650.0000 mg | ORAL_TABLET | ORAL | Status: DC | PRN
  Administered 2017-11-10: 650 mg via ORAL
  Filled 2017-11-09: qty 2

## 2017-11-09 MED ORDER — IBUPROFEN 600 MG PO TABS
600.0000 mg | ORAL_TABLET | Freq: Four times a day (QID) | ORAL | Status: DC
  Administered 2017-11-09 – 2017-11-11 (×7): 600 mg via ORAL
  Filled 2017-11-09 (×7): qty 1

## 2017-11-09 MED ORDER — OXYTOCIN 10 UNIT/ML IJ SOLN
10.0000 [IU] | Freq: Once | INTRAMUSCULAR | Status: AC
  Administered 2017-11-09: 10 [IU] via INTRAMUSCULAR

## 2017-11-09 MED ORDER — MISOPROSTOL 50MCG HALF TABLET
50.0000 ug | ORAL_TABLET | ORAL | Status: DC
  Administered 2017-11-09: 50 ug via ORAL
  Filled 2017-11-09 (×6): qty 1

## 2017-11-09 MED ORDER — SIMETHICONE 80 MG PO CHEW: 80.0000 mg | CHEWABLE_TABLET | ORAL | Status: DC | PRN

## 2017-11-09 MED ORDER — DIBUCAINE 1 % RE OINT: 1.0000 "application " | TOPICAL_OINTMENT | RECTAL | Status: DC | PRN

## 2017-11-09 MED ORDER — SENNOSIDES-DOCUSATE SODIUM 8.6-50 MG PO TABS
2.0000 | ORAL_TABLET | ORAL | Status: DC
  Administered 2017-11-10: 2 via ORAL
  Filled 2017-11-09: qty 2

## 2017-11-09 MED ORDER — ONDANSETRON HCL 4 MG/2ML IJ SOLN: 4.0000 mg | INTRAMUSCULAR | Status: DC | PRN

## 2017-11-09 MED ORDER — ONDANSETRON HCL 4 MG PO TABS
4.0000 mg | ORAL_TABLET | ORAL | Status: DC | PRN
Start: 2017-11-09 — End: 2017-11-11

## 2017-11-09 MED ORDER — OXYCODONE-ACETAMINOPHEN 5-325 MG PO TABS
2.0000 | ORAL_TABLET | ORAL | Status: DC | PRN
  Administered 2017-11-09: 2 via ORAL
  Filled 2017-11-09: qty 2

## 2017-11-09 MED ORDER — WITCH HAZEL-GLYCERIN EX PADS
1.0000 | MEDICATED_PAD | CUTANEOUS | Status: DC | PRN
Start: 2017-11-09 — End: 2017-11-11

## 2017-11-09 NOTE — Anesthesia Pain Management Evaluation Note (Signed)
  [facility] Pain Management Visit Note  Patient: Kayla George, 26 y.o., female  "Hello I am a [facility] the [facility]. We have an anesthesia team available at all times to provide care throughout the hospital, including epidural management and anesthesia for C-section. I don't know your plan for the delivery whether it a natural birth, water birth, IV sedation, nitrous supplementation, doula or epidural, but we want to meet your pain goals."   1.Was your pain managed to your expectations on prior hospitalizations?   Yes   2.What is your expectation for pain management during this hospitalization?     Labor support without medications, Epidural and IV pain meds  3.How can we help you reach that goal? Be available  Record the patient's initial score and the patient's pain goal.   Pain: 5  Pain Goal: 5 [facility] wants you to be able to say your pain was always managed very well.  Walter O[facility] CenterMERRITT,Carren Blakley 11/09/2017

## 2017-11-09 NOTE — Progress Notes (Signed)
Patient comfortable in no acute distress, cervix 50% effaced, 1.5 cm dilated.  Started 50mg  buccal Cytotec.

## 2017-11-09 NOTE — H&P (Signed)
LABOR AND DELIVERY ADMISSION HISTORY AND PHYSICAL NOTE  Kayla George is a 26 y.o. female G2P1001 with IUP at 68w1dby LMP c/w 7 week scan presenting with ROM at 0700 this morning.  She reports positive fetal movement. This morning had painless gush of clear liquid, then experienced contractions, POCT fern test positive.  [facility]:  - pre-existing diabetes, t/w metformin 5034mBID & glyburide 81m47mID, GHTN, RHneg  Past Medical History: Past Medical History:  Diagnosis Date  . [facility]   Past Surgical History: Past Surgical History:  Procedure Laterality Date  . NO PAST SURGERIES      Obstetrical History: OB History    Gravida Para Term Preterm AB Living   '2 1 1 ' 0 0 1   SAB TAB Ectopic Multiple Live Births   0 0 0   1      Social History: Social History   Socioeconomic History  . Marital status: Married    Spouse name: None  . Number of children: None  . Years of education: None  . Highest education level: None  Social Needs  . Financial resource strain: None  . Food insecurity - worry: None  . Food insecurity - inability: None  . Transportation needs - medical: None  . Transportation needs - non-medical: None  Occupational History  . None  Tobacco Use  . Smoking status: Never Smoker  . Smokeless tobacco: Never Used  Substance and Sexual Activity  . Alcohol use: No  . Drug use: No  . Sexual activity: Yes    Birth control/protection: None  Other Topics Concern  . None  Social History Narrative   ** Merged History Encounter **        Family History: History reviewed. No pertinent family history.  Allergies: Allergies  Allergen Reactions  . Eggs Or Egg-Derived Products Rash    Medications Prior to Admission  Medication Sig Dispense Refill Last Dose  . glyBURIDE (DIABETA) 5 MG tablet Take 1 tablet (5 mg total) by mouth 2 (two) times daily with a meal. 60  tablet 3 11/08/2017 at Unknown time  . metFORMIN (GLUCOPHAGE) 500 MG tablet 500 mg (1 tablet) in morning and 1000 mg (2 tablets) at bedtime. 120 tablet 3 11/07/2017 at Unknown time  . Prenatal Vit-Fe Fumarate-FA (PREPLUS) 27-1 MG TABS Take 1 tablet daily by mouth. 30 tablet 13 11/08/2017 at Unknown time  . blood glucose meter kit and supplies KIT Check blood sugar TID & QHS. (FOR ICD-9 250.00, 250.01). 1 each 0 Taking  . GlucoCom Lancets MISC Check blood sugar four times a day as instructed 100 each 3 Taking  . glucose blood test strip Use as instructed 100 each 12 Taking  . ranitidine (ZANTAC) 150 MG tablet Take 1 tablet (150 mg total) by mouth 2 (two) times daily. (Patient taking differently: Take 150 mg by mouth 2 (two) times daily as needed for heartburn. ) 60 tablet 1 prn     Review of Systems  All systems reviewed and negative except as stated in HPI  Physical Exam Blood pressure 136/85, pulse 89, temperature 98.5 F (36.9 C), temperature source Oral, resp. rate 18, height 5' 9.5" (1.765 m), weight 105.7 kg (233 lb), last menstrual period 02/22/2017, SpO2 99 %, unknown if currently breastfeeding. General appearance: alert and cooperative Lungs: clear to auscultation bilaterally Heart: regular rate and rhythm Abdomen: soft, non-tender; bowel sounds normal Extremities: No calf swelling or tenderness Presentation:  cephalic Fetal monitoring: category I Uterine activity: irregular     Prenatal labs: ABO, Rh: B/Negative/-- (07/26 1203) Antibody: Negative (10/26 1304) Rubella: 4.47 (07/26 1203) RPR: Non Reactive (10/26 1304)  HBsAg: Negative (07/26 1203)  HIV: nonreactive GC/Chlamydia: neg/neg GBS: neg 1 hr Glucola: not indicated Genetic screening:  Quad screen negative Anatomy US: normal  Prenatal Transfer Tool  Maternal Diabetes: Yes:  Diabetes Type:  Pre-pregnancy Genetic Screening: Normal Maternal Ultrasounds/Referrals: Normal Fetal Ultrasounds or other Referrals:   Referred to Materal Fetal Medicine  Maternal Substance Abuse:  No Significant Maternal Medications:  Meds include: Zantac Significant Maternal Lab Results: Lab values include: Rh negative  Results for orders placed or performed during the hospital encounter of 11/09/17 (from the past 24 hour(s))  POCT fern test   Collection Time: 11/09/17  9:27 AM  Result Value Ref Range   POCT Fern Test Positive = ruptured amniotic membanes   CBC   Collection Time: 11/09/17  9:56 AM  Result Value Ref Range   WBC 7.5 4.0 - 10.5 K/uL   RBC 4.78 3.87 - 5.11 MIL/uL   Hemoglobin 13.5 12.0 - 15.0 g/dL   HCT 40.8 36.0 - 46.0 %   MCV 85.4 78.0 - 100.0 fL   MCH 28.2 26.0 - 34.0 pg   MCHC 33.1 30.0 - 36.0 g/dL   RDW 14.3 11.5 - 15.5 %   Platelets 239 150 - 400 K/uL  Glucose, capillary   Collection Time: 11/09/17 10:12 AM  Result Value Ref Range   Glucose-Capillary 100 (H) 65 - 99 mg/dL    Patient Active Problem List   Diagnosis Date Noted  . Gestational hypertension, antepartum 11/02/2017  . Preexisting diabetes complicating pregnancy, antepartum 06/23/2017  . Supervision of high-risk pregnancy 06/23/2017  . Rh negative status during pregnancy 06/23/2017    Assessment: Kayla George is a 26 y.o. G2P1001 at 39w1dhere with likely ROM,  POCT fern test positive. Contractions are currently irregular, she consented to Pitocin vs Cytotec (no prior C/S).    #Pre-ex DM2: on metformin/gyburide per chart, does not remember doses or names of meds on interview but says she takes them regularly.  CBGs =<100 on admission.  We will monitor cbgs q4 and use insulin as necessary.   #GHTN: 140s/90s on this admission.  CBC/CMP/urine protein-creatinine ratio.  No severe features.  #RHneg:  Received rhogam '@28wk' , will administer again post partum  #Labor: Expectant management. #Pain: Per maternal request, fentanyl IV. Declines epidural.  #FWB: Category I #ID: GBS neg #MOF: breast #MOC: DEPO #Circ:  n/a  CWilla Frater12/10/2017, 11:46 AM  I attest that I personally saw and interviewed this patient with Mr. TAtha Starks   The entire interview was conducted with sArmed forces training and education officer   Patient was made aware she can always request translator although she does not say she will always need it and has declined on some occasions to nursing.  Dr. SSherene Sires I confirm that I have verified the information documented in the resident's note and that I have also personally reperformed the physical exam and all medical decision making activities.   HMarcille Buffy3:23 PM 11/09/17

## 2017-11-09 NOTE — MAU Note (Signed)
Pt had gush of fluid this morning at 0700 and shows copious amounts of fluid. Pt came to register, walked out and called 911. EMS walked pt in from parking lot.

## 2017-11-10 LAB — RPR: RPR: NONREACTIVE

## 2017-11-10 LAB — GLUCOSE, CAPILLARY: Glucose-Capillary: 106 mg/dL — ABNORMAL HIGH (ref 65–99)

## 2017-11-10 MED ORDER — METFORMIN HCL 500 MG PO TABS
500.0000 mg | ORAL_TABLET | Freq: Two times a day (BID) | ORAL | Status: DC
  Administered 2017-11-10 – 2017-11-11 (×3): 500 mg via ORAL
  Filled 2017-11-10 (×5): qty 1

## 2017-11-10 MED ORDER — AMLODIPINE BESYLATE 10 MG PO TABS
10.0000 mg | ORAL_TABLET | Freq: Every day | ORAL | Status: DC
  Administered 2017-11-10 – 2017-11-11 (×2): 10 mg via ORAL
  Filled 2017-11-10 (×3): qty 1

## 2017-11-10 NOTE — Progress Notes (Signed)
POSTPARTUM PROGRESS NOTE  Post Partum Day 1  Subjective:  Kayla George is a 26 y.o. [id number] 7533w1d s/p SVD.  No acute events overnight.  Pt denies problems with ambulating, voiding or po intake.  She denies nausea or vomiting.  Pain is moderately controlled.  She has had flatus. She has not had bowel movement.  Lochia Small.   She complains of vaginal pain today, likely 2/2 laceration during delivery. Given percocet 5-325 x2 last PM.    Objective: Blood pressure 120/83, pulse 82, temperature 98.2 F (36.8 C), temperature source Oral, resp. rate 20, height 5' 9.5" (1.765 m), weight 105.7 kg (233 lb), last menstrual period 02/22/2017, SpO2 98 %, unknown if currently breastfeeding.  Physical Exam:  General: alert, cooperative and no distress Lochia: normal flow Chest: no respiratory distress Heart:regular rate, distal pulses intact Abdomen: soft, nontender Uterine Fundus: firm, appropriately tender DVT Evaluation: No calf swelling or tenderness Extremities: Minimal edema  Recent Labs    11/09/17 0956  HGB 13.5  HCT 40.8    Assessment/Plan:  ASSESSMENT: Kayla George is a 26 y.o. [id number] 7233w1d s/p SVD. Baby is in NICU for low BGs. Breastfeeding well. Plan to discharge tomorrow.  #Hypertension: Start amlodipine 10mg  daily  #T2DM: D/c glyburide, continue metformin 500 BID.  #Contraception: Plans to start DEPO at postpartum f/u    LOS: 1 day   Kayla HiresChristopher Trennepohl 11/10/2017, 11:11 AM   I confirm that I have verified the information documented in the Med student's note and that I have also personally reperformed the physical exam and all medical decision making activities.  Patient was seen and examined by me also Agree with note Vitals stable  Vitals:   11/09/17 1905 11/09/17 2005 11/09/17 2215 11/10/17 0919  BP: (!) 144/80 140/85 (!) 145/88 120/83  Pulse: 80 70 71 82  Resp: 20 18 18 20   Temp: 98.4 F (36.9 C) 98.1 F (36.7 C) 98.2 F (36.8 C) 98.2 F (36.8 C)   TempSrc: Oral Oral Oral Oral  SpO2: 98%     Weight:      Height:        Labs stable Fundus firm, lochia within normal limits Perineum healing Ext WNL Continue care Anticipate discharge tomorrow  Aviva SignsWilliams, Sydelle Sherfield L, CNM

## 2017-11-10 NOTE — Plan of Care (Signed)
Progressing appropriately.

## 2017-11-10 NOTE — Lactation Note (Signed)
This note was copied from a baby's chart. Lactation Consultation Note Mom speaks Arabic. [employer] interpreter used #[id number] [place]. Mom has 2 other women in rm. W/her. Baby having low glucose issues. Has been given formula several times to bring it up. Mom has BF, but states she has nothing. Mom has pendulous breast, everted nipple at the bottom end of breast, angled towards abd. Mom has to flip breast upwards. Is BF in cradle position. Hand expression taught w/no colostrum noted.  Educated on newborn behavior, feeding habits, importance of STS, and I&O.  Mom is breast and formula feeding. Discussed supply and demand. Encouraged to BF before giving the formula. Discussed importance at this time giving formula d/t low glucose levels. No jitteriness noted at this time. Asked mom several questions, mom stated she didn't have any questions. Encouraged to ask since first time mom, OK to have questions, staff here to help.  Mom encouraged to feed baby 8-12 times/24 hours and with feeding cues. Encouraged strict I&O d/t low blood sugars.  WH/LC brochure given w/resources, support groups and LC services. Mom has WIC.   Patient Name: Kayla George NGEXB'MToday's Date: 11/10/2017 Reason for consult: Initial assessment;Early term 37-38.6wks   Maternal Data Has patient been taught Hand Expression?: Yes Does the patient have breastfeeding experience prior to this delivery?: No  Feeding Feeding Type: Bottle Fed - Formula Nipple Type: Slow - flow  LATCH Score       Type of Nipple: Everted at rest and after stimulation  Comfort (Breast/Nipple): Soft / non-tender        Interventions Interventions: Breast feeding basics reviewed;Breast compression;Support pillows;Breast massage;Position options;Hand express  Lactation Tools Discussed/Used WIC Program: Yes   Consult Status Consult Status: Follow-up Date: 11/10/17 Follow-up type: In-patient    Charyl DancerCARVER, [name] 11/10/2017, 4:28 AM

## 2017-11-10 NOTE — Progress Notes (Signed)
Patient given and instructed on how to use a double electric breast pump.

## 2017-11-11 MED ORDER — AMLODIPINE BESYLATE 10 MG PO TABS: 10.0000 mg | ORAL_TABLET | Freq: Every day | ORAL | 1 refills | Status: DC

## 2017-11-11 MED ORDER — IBUPROFEN 600 MG PO TABS: 600.0000 mg | ORAL_TABLET | Freq: Four times a day (QID) | ORAL | 0 refills | Status: DC

## 2017-11-11 NOTE — Lactation Note (Signed)
This note was copied from a baby's chart. Lactation Consultation Note  Patient Name: Kayla George ORJGY'L Date: 11/11/2017 Reason for consult: Follow-up assessment;NICU baby;Early term 37-38.6wks;Other (Comment)(lactation induction, [place] interpreter - # [id number] / Ammar - Arabic - )  [place] saw mom in room 107.  Ready for [place] asked mom for permission to use female interpreter and mom consented  Via I - pad.  LC reviewed supply and demand and the importance of consistent stimulation until baby can consistent  Help her at the breast for stimulation to protect milk supply.  Per mom has pumped x3 since the DEBP was set up , no milk and in 3 days my milk will come in.  With my 1st baby the milk came in at 3 days.  LC recommended a [facility] DEBP loaner and explained the process.  Per mom will check on baby and then decide.  LC wrapped all her DEBP kit in the basin and plastic bag for D/C. Mother informed of post-discharge support and given phone number to the lactation department, including services for phone call assistance; out-patient appointments; and breastfeeding support group. List of other breastfeeding resources in the community given in the handout. Encouraged mother to call for problems or concerns related to breastfeeding.  LC mentioned to mom she also can breast feed the baby in NICU and also use the DEBP in the pumping rooms in NICU.  LC also mentioned to mom if she goes home without a pump/ to take her DEBP  Kit.  And to have NICU RN call if she changes her mind .    Maternal Data    Feeding Feeding Type: Formula Nipple Type: Slow - flow Length of feed: 20 min  LATCH Score                   Interventions Interventions: Breast feeding basics reviewed;DEBP  Lactation Tools Discussed/Used WIC Program: Yes Pump Review: Setup, frequency, and cleaning(alaready set up 2 days ago , per mom has pumped x 3 without milk )   Consult Status Consult Status:  PRN Follow-up type: In-patient(baby in NICU )    Myer Haff 11/11/2017, 12:45 PM

## 2017-11-11 NOTE — Progress Notes (Signed)
Patient screened out for psychosocial assessment since none of the following apply:  Psychosocial stressors documented in mother or baby's chart  Gestation less than 32 weeks  Code at delivery   Infant with anomalies Please contact the Clinical Social Worker if specific needs arise, or by MOB's request.   Kyan Giannone Boyd-Gilyard, MSW, LCSW Clinical Social Work [phone]  

## 2017-11-11 NOTE — Discharge Instructions (Signed)

## 2017-11-11 NOTE — Discharge Summary (Signed)
OB Discharge Summary     Patient Name: Kayla George DOB: March 26, 1991 MRN: [id number]  Date of admission: 11/09/2017 Delivering MD: Sherene Sires   Date of discharge: 11/11/2017  Admitting diagnosis: 37WKS,WATAER BROKE.,PAIN Intrauterine pregnancy: [redacted]w[redacted]d    Secondary diagnosis:  Active Problems:   * No active hospital problems. *  Additional problems: Gestational Hypertension                                      Preexisting Type 2 Diabetes     Discharge diagnosis: Term Pregnancy Delivered, Gestational Hypertension and Type 2 DM                                                                                                Post partum procedures:none  Augmentation: Pitocin  Complications: None  Except mild shoulder dystocia  Hospital course:  Onset of Labor With Vaginal Delivery     26y.o. yo [id number] 369w1das admitted in Latent Labor on 11/09/2017. Patient had an uncomplicated labor course as follows:  Membrane Rupture Time/Date: 7:50 AM ,11/09/2017   Intrapartum Procedures: Episiotomy: None [1]                                         Lacerations:  2nd degree [3]  Patient had a delivery of a Viable infant. 11/09/2017  Information for the patient's newborn:  SaPricilla, Moehle0[[id number]]Delivery Method: Vaginal, Spontaneous(Filed from Delivery Summary)    Pateint had an uncomplicated postpartum course.  She is ambulating, tolerating a regular diet, passing flatus, and urinating well. Patient is discharged home in stable condition on 11/11/17.   Physical exam  Vitals:   11/09/17 2215 11/10/17 0919 11/10/17 1905 11/11/17 0557  BP: (!) 145/88 120/83 132/77 110/69  Pulse: 71 82 86 69  Resp: '18 20 18 18  ' Temp: 98.2 F (36.8 C) 98.2 F (36.8 C) 98.4 F (36.9 C) (!) 97.4 F (36.3 C)  TempSrc: Oral Oral Oral Oral  SpO2:      Weight:      Height:       General: alert, cooperative and no distress Lochia: appropriate Uterine Fundus: firm Incision: Healing well with  no significant drainage DVT Evaluation: No evidence of DVT seen on physical exam. Labs: Lab Results  Component Value Date   WBC 7.5 11/09/2017   HGB 13.5 11/09/2017   HCT 40.8 11/09/2017   MCV 85.4 11/09/2017   PLT 239 11/09/2017   CMP Latest Ref Rng & Units 11/09/2017  Glucose 65 - 99 mg/dL 105(H)  BUN 6 - 20 mg/dL 7  Creatinine 0.44 - 1.00 mg/dL 0.54  Sodium 135 - 145 mmol/L 135  Potassium 3.5 - 5.1 mmol/L 4.2  Chloride 101 - 111 mmol/L 106  CO2 22 - 32 mmol/L 20(L)  Calcium 8.9 - 10.3 mg/dL 9.4  Total Protein 6.5 - 8.1 g/dL 6.9  Total Bilirubin 0.3 -  1.2 mg/dL 0.7  Alkaline Phos 38 - 126 U/L 178(H)  AST 15 - 41 U/L 23  ALT 14 - 54 U/L 15    Discharge instruction: per After Visit Summary and "Baby and Me Booklet".  After visit meds:  Allergies as of 11/11/2017      Reactions   Eggs Or Egg-derived Products Rash      Medication List    TAKE these medications   amLODipine 10 MG tablet Commonly known as:  NORVASC Take 1 tablet (10 mg total) by mouth daily.   blood glucose meter kit and supplies Kit Check blood sugar TID & QHS. (FOR ICD-9 250.00, 250.01).   GlucoCom Lancets Misc Check blood sugar four times a day as instructed   glucose blood test strip Use as instructed   glyBURIDE 5 MG tablet Commonly known as:  DIABETA Take 1 tablet (5 mg total) by mouth 2 (two) times daily with a meal.   ibuprofen 600 MG tablet Commonly known as:  ADVIL,MOTRIN Take 1 tablet (600 mg total) by mouth every 6 (six) hours.   metFORMIN 500 MG tablet Commonly known as:  GLUCOPHAGE 500 mg (1 tablet) in morning and 1000 mg (2 tablets) at bedtime.   PREPLUS 27-1 MG Tabs Take 1 tablet daily by mouth.   ranitidine 150 MG tablet Commonly known as:  ZANTAC Take 1 tablet (150 mg total) by mouth 2 (two) times daily. What changed:    when to take this  reasons to take this       Diet: carb modified diet  Activity: Advance as tolerated. Pelvic rest for 6 weeks.    Outpatient follow up:1 week BP check then 4 weeks Follow up Appt:No future appointments. Follow up Visit:No Follow-up on file.  Postpartum contraception: Depo Provera  Newborn Data: Live born female  Birth Weight: 7 lb 6.5 oz (3360 g) APGAR: 7, 9  Newborn Delivery   Birth date/time:  11/09/2017 17:14:00 Delivery type:  Vaginal, Spontaneous     Baby Feeding: Breast Disposition:home with mother   11/11/2017 Hansel Feinstein, CNM

## 2017-12-06 IMAGING — US US FETAL BPP W/ NON-STRESS
1 series · 13 of 13 positions shown · non-contrast
Comparison: none

[Series 1: us fetal bpp w/nonstress · 13 acquisitions, 13 frames shown]
[im 1/13]
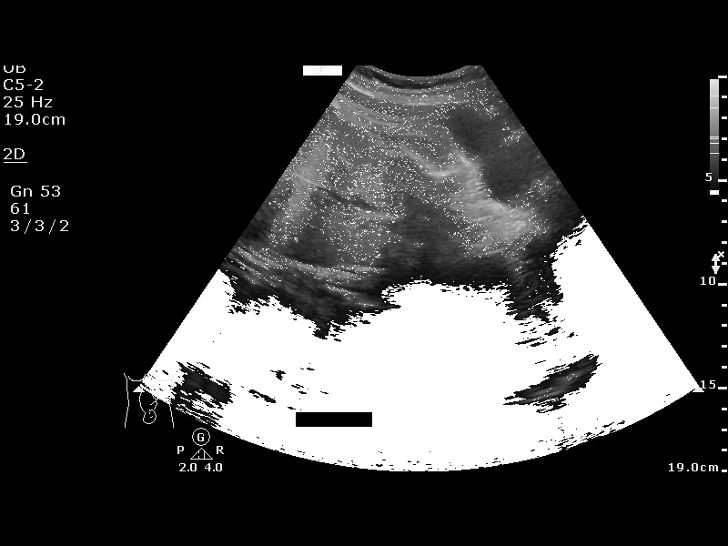
[im 2/13]
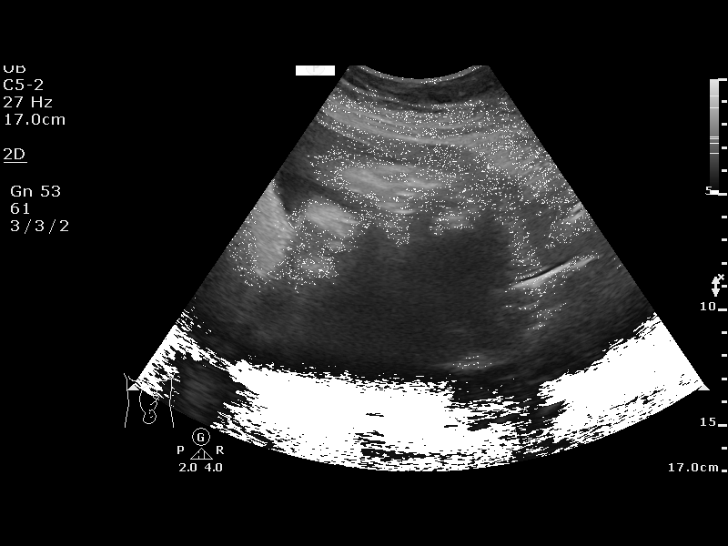
[im 3/13]
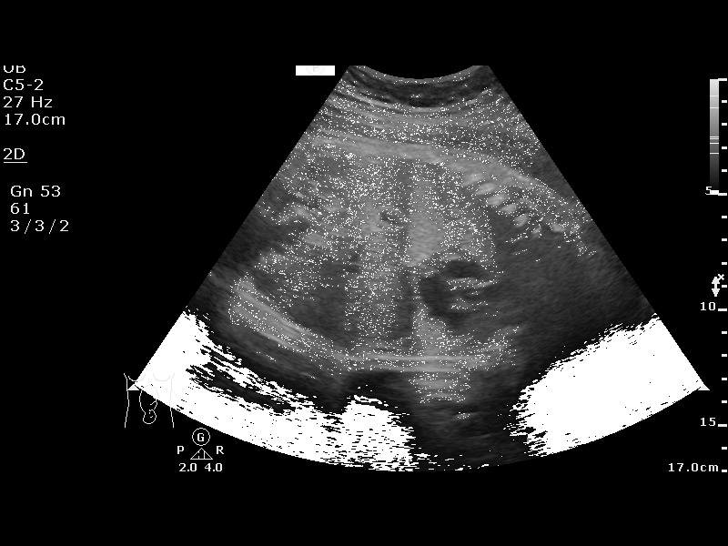
[im 4/13]
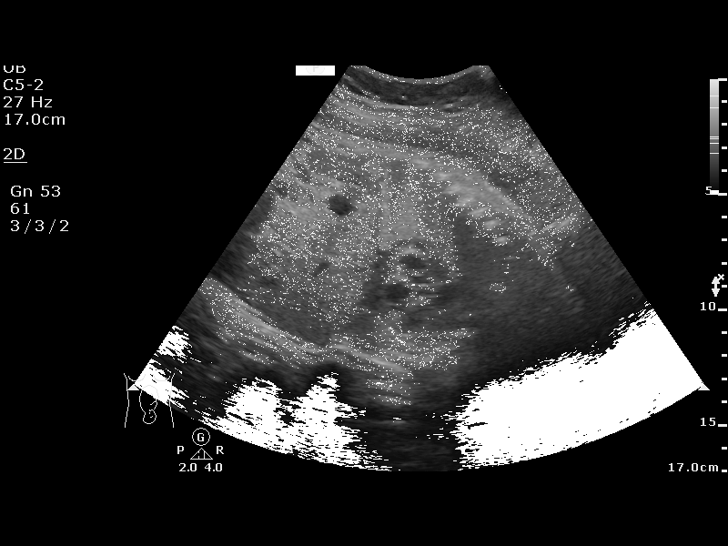
[im 5/13]
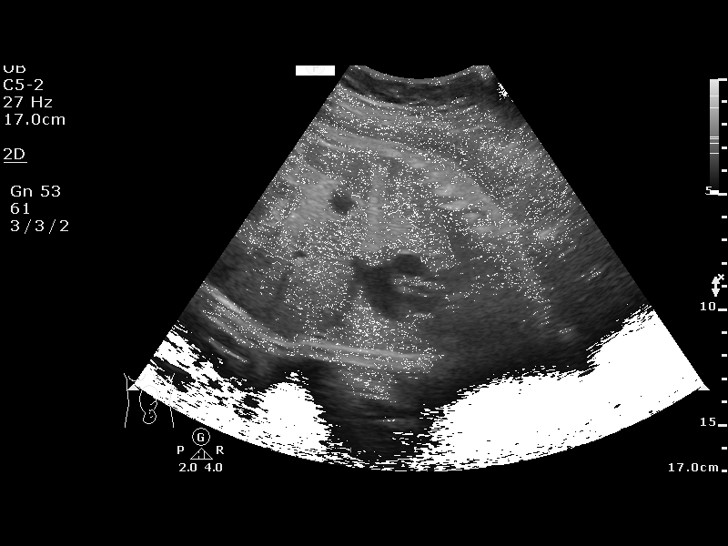
[im 6/13]
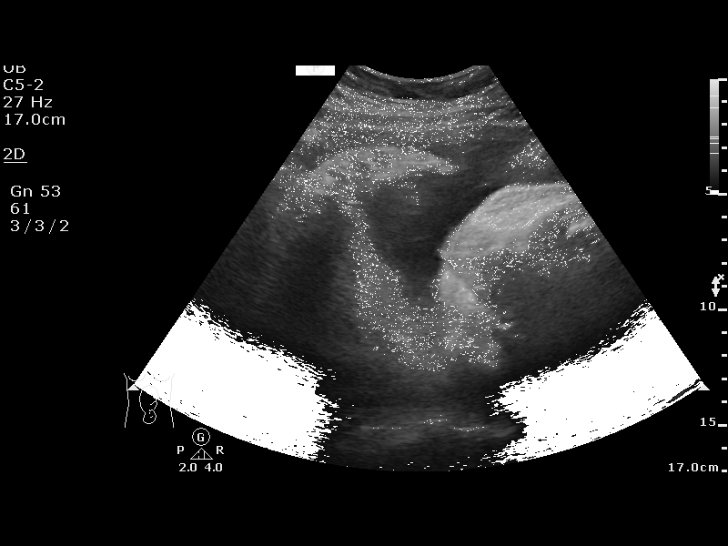
[im 7/13]
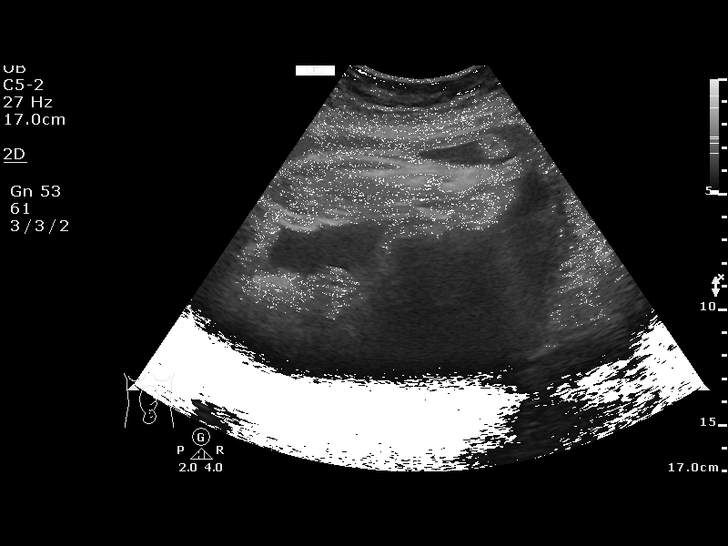
[im 8/13]
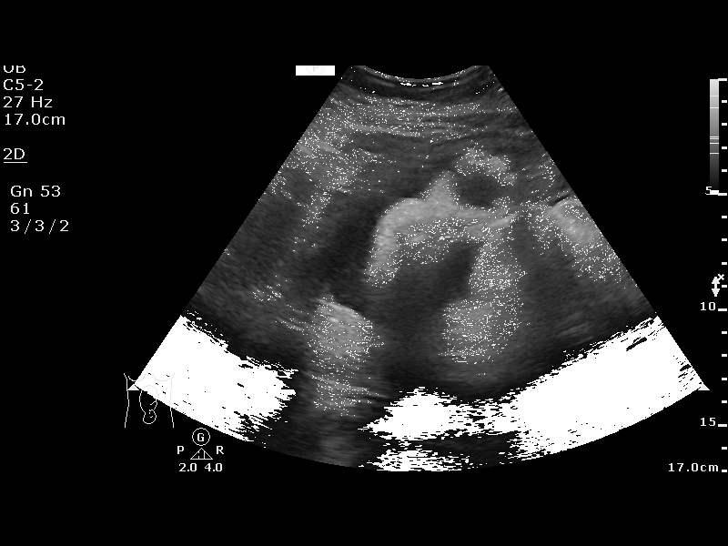
[im 9/13]
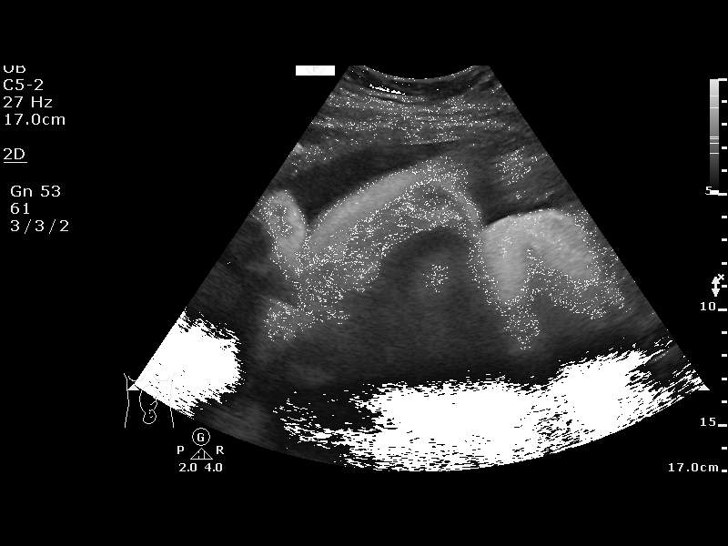
[im 10/13]
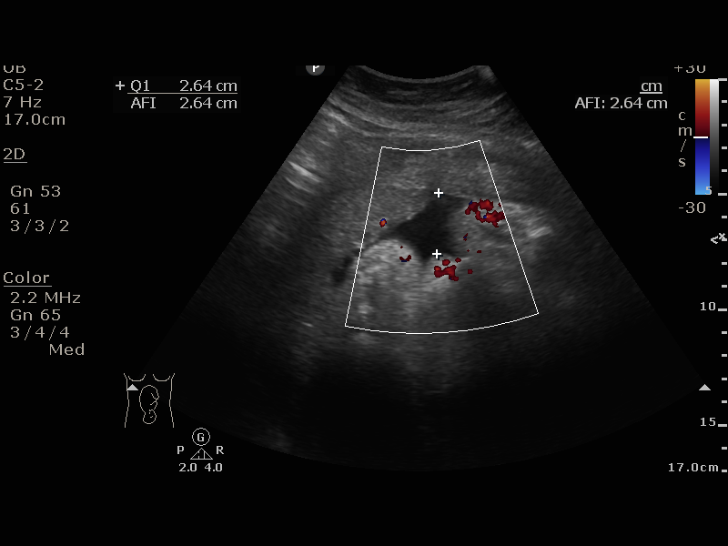
[im 11/13]
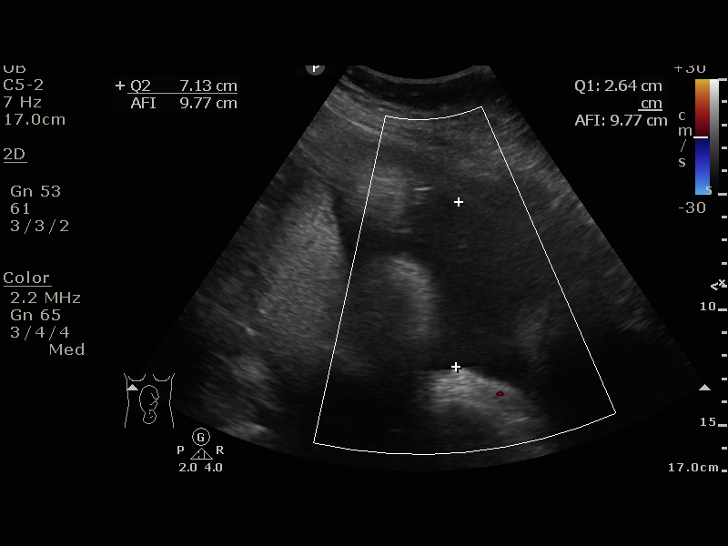
[im 12/13]
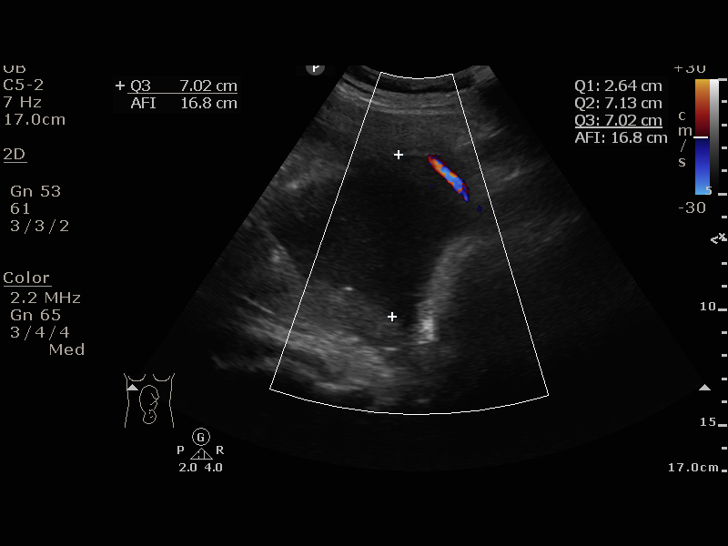
[im 13/13]
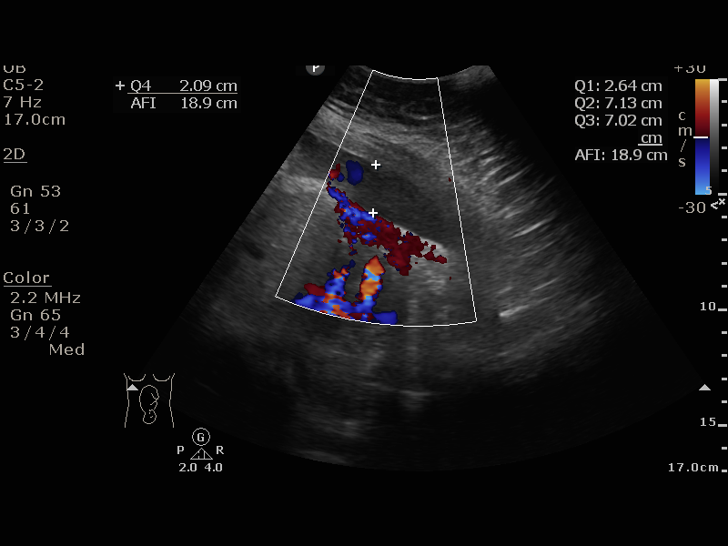

[13 of 13 positions shown; findings below may reference images not displayed]

Attending:        Abanum Valentina         Secondary Phy.:   [facility]-
MAU/Triage
Referred By:      ABIMELK TIGER               [facility]:         [facility]
[REDACTED]

1  US FETAL BPP W/NONSTRESS                    76818.4

1  IHAN VARO           [id number]      [id number]     [id number]
Service(s) Provided

Indications

36 weeks gestation of pregnancy
Unspecified diabetes in pregnancy, third
trimester
OB History

Gravidity:    2         Term:   1        Prem:   0        SAB:   0
TOP:          0       Ectopic:  0        Living: 1
Fetal Evaluation

Num Of Fetuses:     1
Preg. Location:     Intrauterine
Cardiac Activity:   Observed
Presentation:       Cephalic

Amniotic Fluid
AFI FV:      Subjectively within normal limits

AFI Sum(cm)     %Tile       Largest Pocket(cm)
18.88           71

RUQ(cm)       RLQ(cm)       LUQ(cm)        LLQ(cm)
2.64
Biophysical Evaluation

Amniotic F.V:   Pocket => 2 cm two         F. Tone:        Observed
planes
F. Movement:    Observed                   Score:          [DATE]
F. Breathing:   Observed
Gestational Age

LMP:           36w 0d        Date:  02/22/17                 EDD:   11/29/17
Best:          36w 0d     Det. By:  LMP  (02/22/17)          EDD:   11/29/17
Impression

IUP at
Normal amniotic fluid volume
BPP [DATE]
Recommendations

Continue recommended antenatal testing.

## 2017-12-23 ENCOUNTER — Ambulatory Visit (INDEPENDENT_AMBULATORY_CARE_PROVIDER_SITE_OTHER): Payer: PRIVATE HEALTH INSURANCE | Admitting: Obstetrics and Gynecology

## 2017-12-23 ENCOUNTER — Encounter: Payer: Self-pay | Admitting: Obstetrics and Gynecology

## 2017-12-23 DIAGNOSIS — O24319 Unspecified pre-existing diabetes mellitus in pregnancy, unspecified trimester: Secondary | ICD-10-CM

## 2017-12-23 DIAGNOSIS — O139 Gestational [pregnancy-induced] hypertension without significant proteinuria, unspecified trimester: Secondary | ICD-10-CM

## 2017-12-23 NOTE — Progress Notes (Signed)
Subjective:     Kayla George is a 27 y.o. female who presents for a postpartum visit. She is 7 weeks postpartum following a spontaneous vaginal delivery. I have fully reviewed the prenatal and intrapartum course. Prenatal course complicated by gHTN and T2DM. The delivery was at 37.1 gestational weeks. Outcome: spontaneous vaginal delivery complicated by shoulder dystocia. Anesthesia: none. Postpartum course has been uncomplicated. Baby's course has been uncomplicated. Baby is feeding by breast. Bleeding pink. Bowel function is normal. Bladder function is normal. Patient is not sexually active. Contraception method is pills. Postpartum depression screening: negative.  The following portions of the patient's history were reviewed and updated as appropriate: allergies, current medications, past family history, past medical history, past social history, past surgical history and problem list.  Review of Systems Pertinent items are noted in HPI.   Objective:    BP 125/90   Pulse 79   Wt 97.5 kg (215 lb)   LMP 02/22/2017   BMI 31.29 kg/m   General:  alert, cooperative and no distress   Breasts:  negative  Lungs: clear to auscultation bilaterally  Heart:  regular rate and rhythm  Abdomen: soft, non-tender; bowel sounds normal; no masses,  no organomegaly   Vulva:  normal  Vagina: normal vagina, no discharge, exudate, lesion, or erythema  Cervix:  no lesions        Assessment:   Normal postpartum exam.   Plan:    1. Contraception: patient desires POPs but unable to give urine specimen for pregnancy test. Patient to come back in on Monday for Upreg.  2. GHTN: resolved. Normal BP today. Not on meds. 3. T2DM: will not need 2hr GTT postpartum due to preexisting DM. Encouraged patient to f/u with PCP for continued management.  3. Follow up in: 1 year or as needed.    Caryl AdaJazma Phelps, DO OB Fellow [facility]

## 2017-12-24 ENCOUNTER — Encounter: Payer: Self-pay | Admitting: Obstetrics and Gynecology

## 2017-12-26 ENCOUNTER — Other Ambulatory Visit (INDEPENDENT_AMBULATORY_CARE_PROVIDER_SITE_OTHER): Payer: PRIVATE HEALTH INSURANCE

## 2017-12-26 DIAGNOSIS — Z8639 Personal history of other endocrine, nutritional and metabolic disease: Secondary | ICD-10-CM

## 2017-12-26 DIAGNOSIS — Z3202 Encounter for pregnancy test, result negative: Secondary | ICD-10-CM

## 2017-12-26 LAB — POCT PREGNANCY, URINE: Preg Test, Ur: NEGATIVE

## 2017-12-27 ENCOUNTER — Telehealth: Payer: Self-pay | Admitting: General Practice

## 2017-12-27 LAB — GLUCOSE TOLERANCE, 2 HOURS
GLUCOSE, 2 HOUR: 239 mg/dL — AB (ref 65–139)
Glucose, GTT - Fasting: 108 mg/dL — ABNORMAL HIGH (ref 65–99)

## 2017-12-27 NOTE — Telephone Encounter (Addendum)
Opened in error

## 2018-01-28 ENCOUNTER — Other Ambulatory Visit: Payer: Self-pay

## 2018-01-28 ENCOUNTER — Emergency Department (HOSPITAL_COMMUNITY): Payer: Medicaid Other

## 2018-01-28 ENCOUNTER — Emergency Department (HOSPITAL_COMMUNITY)
Admission: EM | Admit: 2018-01-28 | Discharge: 2018-01-29 | Disposition: A | Discharge status: Home / Self Care | Payer: Medicaid Other | Attending: Emergency Medicine | Admitting: Emergency Medicine

## 2018-01-28 ENCOUNTER — Encounter (HOSPITAL_COMMUNITY): Payer: Self-pay

## 2018-01-28 DIAGNOSIS — E119 Type 2 diabetes mellitus without complications: Secondary | ICD-10-CM | POA: Diagnosis not present

## 2018-01-28 DIAGNOSIS — R1084 Generalized abdominal pain: Secondary | ICD-10-CM | POA: Diagnosis not present

## 2018-01-28 DIAGNOSIS — R112 Nausea with vomiting, unspecified: Secondary | ICD-10-CM | POA: Insufficient documentation

## 2018-01-28 LAB — COMPREHENSIVE METABOLIC PANEL
ALT: 19 U/L (ref 14–54)
ANION GAP: 15 (ref 5–15)
AST: 20 U/L (ref 15–41)
Albumin: 3.8 g/dL (ref 3.5–5.0)
Alkaline Phosphatase: 103 U/L (ref 38–126)
BILIRUBIN TOTAL: 0.9 mg/dL (ref 0.3–1.2)
BUN: 7 mg/dL (ref 6–20)
CHLORIDE: 98 mmol/L — AB (ref 101–111)
CO2: 22 mmol/L (ref 22–32)
Calcium: 9.3 mg/dL (ref 8.9–10.3)
Creatinine, Ser: 0.56 mg/dL (ref 0.44–1.00)
GFR calc Af Amer: >60 mL/min (ref >60)
Glucose, Bld: 136 mg/dL — ABNORMAL HIGH (ref 65–99)
POTASSIUM: 3.7 mmol/L (ref 3.5–5.1)
Sodium: 135 mmol/L (ref 135–145)
TOTAL PROTEIN: 8.2 g/dL — AB (ref 6.5–8.1)

## 2018-01-28 LAB — URINALYSIS, ROUTINE W REFLEX MICROSCOPIC
Bilirubin Urine: NEGATIVE
Glucose, UA: NEGATIVE mg/dL
Hgb urine dipstick: NEGATIVE
KETONES UR: 5 mg/dL — AB
Leukocytes, UA: NEGATIVE
NITRITE: NEGATIVE
PH: 5 (ref 5.0–8.0)
PROTEIN: NEGATIVE mg/dL
Specific Gravity, Urine: 1.028 (ref 1.005–1.030)

## 2018-01-28 LAB — I-STAT BETA HCG BLOOD, ED (MC, WL, AP ONLY): I-stat hCG, quantitative: <5 m[IU]/mL (ref <5)

## 2018-01-28 LAB — CBC
HEMATOCRIT: 42 % (ref 36.0–46.0)
Hemoglobin: 14.9 g/dL (ref 12.0–15.0)
MCH: 29.6 pg (ref 26.0–34.0)
MCHC: 35.5 g/dL (ref 30.0–36.0)
MCV: 83.5 fL (ref 78.0–100.0)
Platelets: 339 10*3/uL (ref 150–400)
RBC: 5.03 MIL/uL (ref 3.87–5.11)
RDW: 14.5 % (ref 11.5–15.5)
WBC: 9.2 10*3/uL (ref 4.0–10.5)

## 2018-01-28 LAB — LIPASE, BLOOD: LIPASE: 32 U/L (ref 11–51)

## 2018-01-28 MED ORDER — ONDANSETRON HCL 4 MG/2ML IJ SOLN
4.0000 mg | Freq: Once | INTRAMUSCULAR | Status: AC
  Administered 2018-01-28: 4 mg via INTRAVENOUS
  Filled 2018-01-28: qty 2

## 2018-01-28 MED ORDER — ONDANSETRON 4 MG PO TBDP: 4.0000 mg | ORAL_TABLET | Freq: Three times a day (TID) | ORAL | 0 refills | Status: DC | PRN

## 2018-01-28 MED ORDER — IOPAMIDOL (ISOVUE-300) INJECTION 61%
INTRAVENOUS | Status: AC
  Administered 2018-01-28: 100 mL
  Filled 2018-01-28: qty 100

## 2018-01-28 MED ORDER — SODIUM CHLORIDE 0.9 % IV BOLUS (SEPSIS)
1000.0000 mL | Freq: Once | INTRAVENOUS | Status: AC
  Administered 2018-01-28: 1000 mL via INTRAVENOUS

## 2018-01-28 MED ORDER — IOPAMIDOL (ISOVUE-370) INJECTION 76%
INTRAVENOUS | Status: AC
  Filled 2018-01-28: qty 50

## 2018-01-28 NOTE — Discharge Instructions (Signed)
Follow up with your primary care doctor to discuss your hospital visit. Continue to hydrate orally with small sips of fluids throughout the day. Use Zofran as directed for nausea & vomiting.   The 'BRAT' diet is suggested, then progress to diet as tolerated as symptoms abate.  Bananas.  Rice.  Applesauce.  Toast (and other simple starches such as crackers, potatoes, noodles).   SEEK IMMEDIATE MEDICAL ATTENTION IF: You begin having localized abdominal pain that does not go away or becomes severe A temperature above 101 develops Repeated vomiting occurs (multiple uncontrollable episodes) or you are unable to keep fluids down Blood in your stool New or worsening symptoms, any additional concerns.

## 2018-01-28 NOTE — ED Provider Notes (Signed)
[facility]ENC[facility] DEPARTMENT Provider Note   CSN: [id number] Arrival date & time: 01/28/18  1929     History   Chief Complaint Chief Complaint  Patient presents with  . Abdominal Pain    HPI Kayla George is a 27 y.o. female.  The history is provided by the patient and medical records. A language interpreter was used (Passenger transport manager.).  Abdominal Pain   Associated symptoms include nausea, vomiting and constipation. Pertinent negatives include diarrhea.    Kayla George is a 27 y.o. female  with a PMH of DM who presents to the Emergency Department complaining of pounding, non-radiating abdominal pain x 4 days. Associated with nausea and vomiting. She reports about 3-4 episodes of emesis each day. No diarrhea. She actually has not had any bowel movements in the last 4-5 days. Pain is worse with certain movements. Denies sick contacts. No medication taken prior to arrival for symptoms. No vaginal discharge, pelvic pain, urinary symptoms.   Past Medical History:  Diagnosis Date  . Diabetes mellitus without complication [facility])     Patient Active Problem List   Diagnosis Date Noted  . Gestational hypertension, antepartum 11/02/2017  . Preexisting diabetes complicating pregnancy, antepartum 06/23/2017  . Supervision of high-risk pregnancy 06/23/2017  . Rh negative status during pregnancy 06/23/2017    Past Surgical History:  Procedure Laterality Date  . NO PAST SURGERIES      OB History    Gravida Para Term Preterm AB Living   '2 2 2 ' 0 0 2   SAB TAB Ectopic Multiple Live Births   0 0 0 0 2       Home Medications    Prior to Admission medications   Medication Sig Start Date End Date Taking? Authorizing Provider  amLODipine (NORVASC) 10 MG tablet Take 1 tablet (10 mg total) by mouth daily. Patient not taking: Reported on 12/23/2017 11/11/17   Seabron Spates, CNM  blood glucose meter kit and supplies KIT Check blood sugar TID & QHS. (FOR  ICD-9 250.00, 250.01). Patient not taking: Reported on 12/23/2017 03/12/15   Lance Bosch, NP  GlucoCom Lancets MISC Check blood sugar four times a day as instructed Patient not taking: Reported on 12/23/2017 09/23/17   Tamala Julian, [place], [place]  glucose blood test strip Use as instructed Patient not taking: Reported on 12/23/2017 09/23/17   Tamala Julian, [place], CNM  glyBURIDE (DIABETA) 5 MG tablet Take 1 tablet (5 mg total) by mouth 2 (two) times daily with a meal. Patient not taking: Reported on 12/23/2017 11/02/17   Tamala Julian, [place], CNM  ibuprofen (ADVIL,MOTRIN) 600 MG tablet Take 1 tablet (600 mg total) by mouth every 6 (six) hours. Patient not taking: Reported on 12/23/2017 11/11/17   Seabron Spates, CNM  metFORMIN (GLUCOPHAGE) 500 MG tablet 500 mg (1 tablet) in morning and 1000 mg (2 tablets) at bedtime. Patient not taking: Reported on 12/23/2017 11/02/17   Tamala Julian, [place], CNM  ondansetron (ZOFRAN ODT) 4 MG disintegrating tablet Take 1 tablet (4 mg total) by mouth every 8 (eight) hours as needed for nausea or vomiting. 01/28/18   Particia Strahm, Ozella Almond, PA-C  Prenatal Vit-Fe Fumarate-FA (PREPLUS) 27-1 MG TABS Take 1 tablet daily by mouth. Patient not taking: Reported on 12/23/2017 10/04/17   Constant, Peggy, MD  ranitidine (ZANTAC) 150 MG tablet Take 1 tablet (150 mg total) by mouth 2 (two) times daily. Patient not taking: Reported on 12/23/2017 10/28/17   Chancy Milroy, MD    Family History  History reviewed. No pertinent family history.  Social History Social History   Tobacco Use  . Smoking status: Never Smoker  . Smokeless tobacco: Never Used  Substance Use Topics  . Alcohol use: No  . Drug use: No     Allergies   Eggs or egg-derived products   Review of Systems Review of Systems  Gastrointestinal: Positive for abdominal pain, constipation, nausea and vomiting. Negative for diarrhea.  All other systems reviewed and are negative.    Physical Exam Updated Vital Signs BP (!)  124/95   Pulse 63   Temp 98.4 F (36.9 C) (Oral)   Resp 18   Ht '5\' 4"'  (1.626 m)   Wt 97.5 kg (215 lb)   LMP 01/21/2018   SpO2 98%   Breastfeeding? Yes   BMI 36.90 kg/m   Physical Exam  Constitutional: She is oriented to person, place, and time. She appears well-developed and well-nourished. No distress.  HENT:  Head: Normocephalic and atraumatic.  Cardiovascular: Normal rate, regular rhythm and normal heart sounds.  No murmur heard. Pulmonary/Chest: Effort normal and breath sounds normal. No respiratory distress.  Abdominal: Soft. Bowel sounds are normal. She exhibits no distension.  Tenderness to periumbilical region without rebound or guarding. No CVA tenderness.  Musculoskeletal: She exhibits no edema.  Neurological: She is alert and oriented to person, place, and time.  Skin: Skin is warm and dry.  Nursing note and vitals reviewed.    ED Treatments / Results  Labs (all labs ordered are listed, but only abnormal results are displayed) Labs Reviewed  COMPREHENSIVE METABOLIC PANEL - Abnormal; Notable for the following components:      Result Value   Chloride 98 (*)    Glucose, Bld 136 (*)    Total Protein 8.2 (*)    All other components within normal limits  URINALYSIS, ROUTINE W REFLEX MICROSCOPIC - Abnormal; Notable for the following components:   Ketones, ur 5 (*)    All other components within normal limits  LIPASE, BLOOD  CBC  I-STAT BETA HCG BLOOD, ED ([facility], [facility], [facility] ONLY)    EKG  EKG Interpretation None       Radiology Ct Abdomen Pelvis W Contrast  Result Date: 01/28/2018 CLINICAL DATA:  Patient with generalized abdominal pain. EXAM: CT ABDOMEN AND PELVIS WITH CONTRAST TECHNIQUE: Multidetector CT imaging of the abdomen and pelvis was performed using the standard protocol following bolus administration of intravenous contrast. CONTRAST:  113m ISOVUE-300 IOPAMIDOL (ISOVUE-300) INJECTION 61% COMPARISON:  None. FINDINGS: Lower chest: Normal heart size. Lung  bases are clear. No pleural effusion. Hepatobiliary: Liver is normal in size and contour. Gallbladder is unremarkable. No intrahepatic or extrahepatic biliary ductal dilatation. Pancreas: Unremarkable Spleen: Unremarkable Adrenals/Urinary Tract: Adrenal glands are normal. Kidneys enhance symmetrically with contrast. Urinary bladder is unremarkable. No hydronephrosis. Stomach/Bowel: Normal morphology of the stomach. No evidence for small bowel obstruction. Normal appendix. No free fluid or free intraperitoneal air. Vascular/Lymphatic: Normal caliber abdominal aorta. No retroperitoneal lymphadenopathy. Reproductive: Uterus and adnexal structures are unremarkable. Other: None. Musculoskeletal: No aggressive or acute appearing osseous lesions. IMPRESSION: No acute process within the abdomen or pelvis. Electronically Signed   By: DLovey NewcomerM.D.   On: 01/28/2018 23:05    Procedures Procedures (including critical care time)  Medications Ordered in ED Medications  ondansetron ([facility] injection 4 mg (4 mg Intravenous Given 01/28/18 2129)  sodium chloride 0.9 % bolus 1,000 mL (0 mLs Intravenous Stopped 01/28/18 2346)  iopamidol (ISOVUE-300) 61 % injection (100 mLs  Contrast Given 01/28/18 2207)     Initial Impression / Assessment and Plan / ED Course  I have reviewed the triage vital signs and the nursing notes.  Pertinent labs & imaging results that were available during my care of the patient were reviewed by me and considered in my medical decision making (see chart for details).    Kayla George is a 27 y.o. female who presents to ED for abdominal pain, n/v x 4 days.  Patient is nontoxic, nonseptic appearing with periumbilical tenderness. No rebound or guarding. Afebrile, hemodynamically stable. Zofran and fluid bolus given.  UA with no signs of infection. Labs reassuring. CT abd/pelvis with no acute findings. Patient does not meet the SIRS or Sepsis criteria. On repeat exam, abdominal exam with no  peritoneal signs and very little tenderness. No indication of appendicitis, bowel obstruction, bowel perforation, cholecystitis, diverticulitis, PID or ectopic pregnancy. Patient discharged home with symptomatic treatment and encouraged to follow up with PCP. I have also discussed reasons to return immediately to the ER. Patient expresses understanding and agrees with plan as dictated above.  Final Clinical Impressions(s) / ED Diagnoses   Final diagnoses:  Non-intractable vomiting with nausea, unspecified vomiting type    ED Discharge Orders        Ordered    ondansetron (ZOFRAN ODT) 4 MG disintegrating tablet  Every 8 hours PRN     01/28/18 2333       Kimmie Doren, Ozella Almond, PA-C 01/29/18 0021    Pattricia Boss, MD 01/29/18 [phone]

## 2018-01-28 NOTE — ED Triage Notes (Addendum)
Pt endorses generalized abd pain with n/v since Tuesday, last BM 4 days ago. Triage completed with arabic interpreter. Denies urinary sx.

## 2018-06-23 ENCOUNTER — Ambulatory Visit (INDEPENDENT_AMBULATORY_CARE_PROVIDER_SITE_OTHER): Payer: PRIVATE HEALTH INSURANCE

## 2018-06-23 ENCOUNTER — Ambulatory Visit (HOSPITAL_COMMUNITY)
Admission: EM | Admit: 2018-06-23 | Discharge: 2018-06-23 | Disposition: A | Discharge status: Home / Self Care | Payer: PRIVATE HEALTH INSURANCE | Attending: Family Medicine | Admitting: Family Medicine

## 2018-06-23 ENCOUNTER — Other Ambulatory Visit: Payer: Self-pay

## 2018-06-23 ENCOUNTER — Encounter (HOSPITAL_COMMUNITY): Payer: Self-pay

## 2018-06-23 DIAGNOSIS — M25461 Effusion, right knee: Secondary | ICD-10-CM

## 2018-06-23 DIAGNOSIS — S8391XA Sprain of unspecified site of right knee, initial encounter: Secondary | ICD-10-CM

## 2018-06-23 DIAGNOSIS — M79671 Pain in right foot: Secondary | ICD-10-CM | POA: Diagnosis not present

## 2018-06-23 DIAGNOSIS — W108XXA Fall (on) (from) other stairs and steps, initial encounter: Secondary | ICD-10-CM | POA: Diagnosis not present

## 2018-06-23 DIAGNOSIS — S93601A Unspecified sprain of right foot, initial encounter: Secondary | ICD-10-CM | POA: Diagnosis not present

## 2018-06-23 MED ORDER — IBUPROFEN 800 MG PO TABS: 800.0000 mg | ORAL_TABLET | Freq: Three times a day (TID) | ORAL | 0 refills | Status: AC

## 2018-06-23 NOTE — ED Provider Notes (Signed)
[facility]    CSN: [id number] Arrival date & time: 06/23/18  1649     History   Chief Complaint Chief Complaint  Patient presents with  . Foot Injury    right    HPI Kayla George is a 27 y.o. female.   HPI  Patient is here with right foot pain.  Also complains of right knee pain.  She is here with an interpreter.  She speaks Arabic.  She states that she fell down some stairs a week ago.  She is had pain and swelling in her right foot ever since.  She is unable to fully bear weight on the right foot.  Her right knee is also swollen and moderately painful.  She has pain with range of motion.  Pain with weightbearing.  She states she is never had fractures, arthritis, or problems with this right leg before.  Normally in good health.  Well-controlled diabetes, hypertension, GERD.  Past Medical History:  Diagnosis Date  . Diabetes mellitus without complication [facility](HCC)     Patient Active Problem List   Diagnosis Date Noted  . Gestational hypertension, antepartum 11/02/2017  . Preexisting diabetes complicating pregnancy, antepartum 06/23/2017  . Supervision of high-risk pregnancy 06/23/2017  . Rh negative status during pregnancy 06/23/2017    Past Surgical History:  Procedure Laterality Date  . NO PAST SURGERIES      OB History    Gravida  2   Para  2   Term  2   Preterm  0   AB  0   Living  2     SAB  0   TAB  0   Ectopic  0   Multiple  0   Live Births  2            Home Medications    Prior to Admission medications   Medication Sig Start Date End Date Taking? Authorizing Provider  ibuprofen (ADVIL,MOTRIN) 800 MG tablet Take 1 tablet (800 mg total) by mouth 3 (three) times daily. 06/23/18   Eustace MooreNelson, Emari Demmer Sue, MD    Family History History reviewed. No pertinent family history.  Social History Social History   Tobacco Use  . Smoking status: Never Smoker  . Smokeless tobacco: Never Used  Substance Use Topics  . Alcohol use: No    . Drug use: No     Allergies   Eggs or egg-derived products   Review of Systems Review of Systems  Constitutional: Negative for chills and fever.  HENT: Negative for ear pain and sore throat.   Eyes: Negative for pain and visual disturbance.  Respiratory: Negative for cough and shortness of breath.   Cardiovascular: Negative for chest pain and palpitations.  Gastrointestinal: Negative for abdominal pain and vomiting.  Genitourinary: Negative for dysuria and hematuria.  Musculoskeletal: Positive for arthralgias, gait problem and joint swelling. Negative for back pain.  Skin: Negative for color change and rash.  Neurological: Negative for seizures and syncope.  All other systems reviewed and are negative.    Physical Exam Triage Vital Signs ED Triage Vitals  Enc Vitals Group     BP 06/23/18 1708 121/79     Pulse Rate 06/23/18 1708 86     Resp 06/23/18 1708 18     Temp 06/23/18 1708 98.1 F (36.7 C)     Temp Source 06/23/18 1708 Oral     SpO2 06/23/18 1708 100 %     Weight --      Height --  Head Circumference --      Peak Flow --      Pain Score 06/23/18 1720 9     Pain Loc --      Pain Edu? --      Excl. in GC? --    No data found.  Updated Vital Signs BP 126/90 (BP Location: Left Arm)   Pulse 86   Temp 98.2 F (36.8 C) (Oral)   Resp 17   SpO2 100%   Visual Acuity Right Eye Distance:   Left Eye Distance:   Bilateral Distance:    Right Eye Near:   Left Eye Near:    Bilateral Near:     Physical Exam  Constitutional: She appears well-developed and well-nourished. No distress.  HENT:  Head: Normocephalic and atraumatic.  Mouth/Throat: Oropharynx is clear and moist.  Eyes: Pupils are equal, round, and reactive to light. Conjunctivae are normal.  Neck: Normal range of motion.  Cardiovascular: Normal rate.  Pulmonary/Chest: Effort normal. No respiratory distress.  Abdominal: Soft. She exhibits no distension.  Musculoskeletal: Normal range of  motion. She exhibits no edema.  Right knee has a trace effusion.  Good range of motion.  No instability.  Mild medial joint line tenderness.  Mild medial collateral ligaments tenderness.  Right foot has diffuse swelling across the dorsum.  Tenderness in the mid metatarsal region.  No tenderness with palpation or movement of toes.  No tenderness over the fifth metatarsal.  No tenderness about the ankle joint, medial or lateral malleoli.   Neurological: She is alert.  Skin: Skin is warm and dry.     UC Treatments / Results  Labs (all labs ordered are listed, but only abnormal results are displayed) Labs Reviewed - No data to display  EKG None  Radiology Dg Knee Complete 4 Views Right  Result Date: 06/23/2018 CLINICAL DATA:  Fall with injury to the right knee EXAM: RIGHT KNEE - COMPLETE 4+ VIEW COMPARISON:  None. FINDINGS: No acute displaced fracture or malalignment. Small knee effusion. Multiple oval calcification scattered about the knee, suspect for small calcified loose bodies. IMPRESSION: 1. No acute osseous abnormality.  Small knee effusion 2. Suspected scattered calcified loose bodies about the right knee. Electronically Signed   By: Jasmine Pang M.D.   On: 06/23/2018 18:31   Dg Foot Complete Right  Result Date: 06/23/2018 CLINICAL DATA:  Pain in the central metatarsal region after fall EXAM: RIGHT FOOT COMPLETE - 3+ VIEW COMPARISON:  None. FINDINGS: There is no evidence of fracture or dislocation. There is no evidence of arthropathy or other focal bone abnormality. Dorsal soft tissue swelling. IMPRESSION: No acute osseous abnormality. Electronically Signed   By: Jasmine Pang M.D.   On: 06/23/2018 18:32    Procedures Procedures (including critical care time)  Medications Ordered in UC Medications - No data to display  Initial Impression / Assessment and Plan / UC Course  I have reviewed the triage vital signs and the nursing notes.  Pertinent labs & imaging results that were  available during my care of the patient were reviewed by me and considered in my medical decision making (see chart for details).     Discussed with patient she had sprains and bruises.  No broken bones.  Discussed ice, Ace wrap, ibuprofen as needed for pain.  Return as needed. Final Clinical Impressions(s) / UC Diagnoses   Final diagnoses:  Foot sprain, right, initial encounter  Sprain of right knee, unspecified ligament, initial encounter     Discharge  Instructions     No broken bones.  You do have bruises and sprains.  Limit walking while leg is painful. Right foot to reduce swelling. Take ibuprofen 3 times a day with food.  This for pain and inflammation. See your PCP if not better by next week    ED Prescriptions    Medication Sig Dispense Auth. Provider   ibuprofen (ADVIL,MOTRIN) 800 MG tablet Take 1 tablet (800 mg total) by mouth 3 (three) times daily. 21 tablet Eustace Moore, MD     Controlled Substance Prescriptions [place] Controlled Substance Registry consulted? Not Applicable   Eustace Moore, MD 06/23/18 [phone]

## 2018-06-23 NOTE — Discharge Instructions (Signed)
No broken bones.  You do have bruises and sprains.  Limit walking while leg is painful. Right foot to reduce swelling. Take ibuprofen 3 times a day with food.  This for pain and inflammation. See your PCP if not better by next week

## 2018-06-23 NOTE — ED Triage Notes (Signed)
[facility], pt fell down stairs and hurt rt foot, pt is swollen and tender to touch.

## 2023-01-04 ENCOUNTER — Ambulatory Visit (INDEPENDENT_AMBULATORY_CARE_PROVIDER_SITE_OTHER): Payer: PRIVATE HEALTH INSURANCE | Admitting: Primary Care

## 2024-04-30 ENCOUNTER — Encounter: Payer: Self-pay | Admitting: Obstetrics and Gynecology

## 2024-04-30 ENCOUNTER — Ambulatory Visit: Payer: PRIVATE HEALTH INSURANCE | Admitting: Obstetrics and Gynecology

## 2024-04-30 ENCOUNTER — Other Ambulatory Visit (HOSPITAL_COMMUNITY)
Admission: RE | Admit: 2024-04-30 | Discharge: 2024-04-30 | Disposition: A | Discharge status: Home / Self Care | Source: Ambulatory Visit | Attending: Obstetrics and Gynecology | Admitting: Obstetrics and Gynecology

## 2024-04-30 VITALS — BP 124/89 | HR 79 | Ht 69.0 in | Wt 205.0 lb

## 2024-04-30 DIAGNOSIS — Z124 Encounter for screening for malignant neoplasm of cervix: Secondary | ICD-10-CM

## 2024-04-30 DIAGNOSIS — Z8759 Personal history of other complications of pregnancy, childbirth and the puerperium: Secondary | ICD-10-CM

## 2024-04-30 DIAGNOSIS — Z3169 Encounter for other general counseling and advice on procreation: Secondary | ICD-10-CM

## 2024-04-30 DIAGNOSIS — Z8632 Personal history of gestational diabetes: Secondary | ICD-10-CM

## 2024-04-30 DIAGNOSIS — Z113 Encounter for screening for infections with a predominantly sexual mode of transmission: Secondary | ICD-10-CM

## 2024-04-30 DIAGNOSIS — Z6830 Body mass index (BMI) 30.0-30.9, adult: Secondary | ICD-10-CM

## 2024-04-30 NOTE — Patient Instructions (Signed)
 The best days to get pregnant are the 1-2 days before you release an egg (ovulate) and the day you release an egg.  You can predict the day of ovulation in 4 ways: Tracking with a calendar. This works best if you have extremely regular menstrual cycles. Have intercourse every other day for a week starting after your period ends Tracking changes to cervical mucus. You are most likely to conceive when cervical mucus is clear and slippery. Tracking your body temperature. This is less helpful because your body temperature doesn't rise until AFTER you ovulated (too late to time intercourse) It is ideal to use a special basal body temperature thermometer since it can pick up subtle differences. You take your temperature first thing in the morning - before you get out of bed, use the bathroom, or eat/drink anything. You will need to chart your temperature every day to be able to pick up on a meaningful rise. Tracking with ovulation predictor kits. These are urine tests that you do at home. They pick up a hormone in your urine called "LH" which spikes right before you ovulate. You should time intercourse to the day of the [facility] spike. You want to start testing in the 5 days before you think you'll ovulate, but the kits usually come with instructions that explain when to use them.  If you are trying to get pregnant, take a prenatal vitamin every day. They work best to help your baby's brain & spinal cord growth if you're taking them BEFORE you get pregnant.

## 2024-04-30 NOTE — Progress Notes (Signed)
   NEW GYNECOLOGY VISIT  Subjective:  Kayla George is a 33 y.o. [id number] with LMP 5/32/25 presenting for discussion of pregnancy  Trying to conceive x 3 years with unprotected intercourse. Is not timing intercourse. Has regular cycles that vary in length by a couple of days. Last 7 days, no significant pain, no heavy bleeding. Her husband/current partner fathered her two children.   Prior pregnancy c/b gHTN, gestational diabetes, Rh negative, 20 second shoulder dystocia - 7lb 6oz infant. She notes increasing abdominal girth but no changes in appetite, nausea/vomiting or issues with bowel/bladder function. She denies any current medical problems or concerns other than that she wants to conceive.   Patient interviewed & counseled with in person Arabic interpreter  Objective:   Vitals:   04/30/24 0933  BP: 124/89  Pulse: 79  Weight: 205 lb (93 kg)  Height: 5\' 9"  (1.753 m)   General:  Alert, oriented and cooperative. Patient is in no acute distress.  Skin: Skin is warm and dry. No rash noted.   Cardiovascular: Normal heart rate noted  Respiratory: Normal respiratory effort, no problems with respiration noted  Abdomen: Soft, non-tender, non-distended. No mass or organomegaly.   Pelvic: NEFG. Cervix visually normal, pap collected  Exam performed in the presence of a chaperone  Assessment and Plan:  Kayla George is a 33 y.o. with the following:  Pre-conception counseling Discussed the utility of tracking cycles +/- ovulation predictor kits with timed intercourse prior to ovulation and the day of ovulation.  Reviewed the approximately 80% of couples will conceive within the first 6 months of attempting pregnancy and 85% of couples conceive in the first 12 months.  Counseled patient to start PNV If no pregnancy within 6-12 months, will further pursue fertility work up -     CBC -     Comp Met (CMET) -     TSH Rfx on Abnormal to Free T4 -     HgB A1c -     HIV antibody (with reflex) -      HCV Ab w Reflex to Quant PCR -     Hepatitis B Surface AntiGEN -     RPR  History of gestational diabetes mellitus (GDM) -     HgB A1c  History of gestational hypertension -     CBC -     Comp Met (CMET)  BMI 30.0-30.9,adult -     CBC -     Comp Met (CMET) -     TSH Rfx on Abnormal to Free T4 -     HgB A1c  Screening examination for STI -     HIV antibody (with reflex) -     HCV Ab w Reflex to Quant PCR -     Hepatitis B Surface AntiGEN -     RPR  Cervical cancer screening -     Cytology - PAP( [facility])  Return in about 6 months (around 10/30/2024) for follow up pregnancy or sooner as needed.  No future appointments.   Izell Marsh, MD

## 2024-04-30 NOTE — Progress Notes (Signed)
 Desires pregnancy. Wants to discuss issues. Declines STI testing. On menses today but states not much flow.

## 2024-05-01 ENCOUNTER — Ambulatory Visit: Payer: Self-pay | Admitting: Obstetrics and Gynecology

## 2024-05-01 LAB — COMPREHENSIVE METABOLIC PANEL WITH GFR
ALT: 15 IU/L (ref 0–32)
AST: 12 IU/L (ref 0–40)
Albumin: 4.1 g/dL (ref 3.9–4.9)
Alkaline Phosphatase: 124 IU/L — ABNORMAL HIGH (ref 44–121)
BUN/Creatinine Ratio: 17 (ref 9–23)
BUN: 9 mg/dL (ref 6–20)
Bilirubin Total: 0.4 mg/dL (ref 0.0–1.2)
CO2: 22 mmol/L (ref 20–29)
Calcium: 9.5 mg/dL (ref 8.7–10.2)
Chloride: 98 mmol/L (ref 96–106)
Creatinine, Ser: 0.54 mg/dL — ABNORMAL LOW (ref 0.57–1.00)
Globulin, Total: 3.2 g/dL (ref 1.5–4.5)
Glucose: 381 mg/dL — ABNORMAL HIGH (ref 70–99)
Potassium: 4.3 mmol/L (ref 3.5–5.2)
Sodium: 134 mmol/L (ref 134–144)
Total Protein: 7.3 g/dL (ref 6.0–8.5)
eGFR: 125 mL/min/{1.73_m2} (ref >59)

## 2024-05-01 LAB — CBC
Hematocrit: 46.6 % (ref 34.0–46.6)
Hemoglobin: 15.1 g/dL (ref 11.1–15.9)
MCH: 28.9 pg (ref 26.6–33.0)
MCHC: 32.4 g/dL (ref 31.5–35.7)
MCV: 89 fL (ref 79–97)
Platelets: 284 10*3/uL (ref 150–450)
RBC: 5.22 x10E6/uL (ref 3.77–5.28)
RDW: 12.2 % (ref 11.7–15.4)
WBC: 6 10*3/uL (ref 3.4–10.8)

## 2024-05-01 LAB — TSH RFX ON ABNORMAL TO FREE T4: TSH: 1.89 u[IU]/mL (ref 0.450–4.500)

## 2024-05-01 LAB — HCV AB W REFLEX TO QUANT PCR: HCV Ab: NONREACTIVE

## 2024-05-01 LAB — HEMOGLOBIN A1C
Est. average glucose Bld gHb Est-mCnc: 332 mg/dL
Hgb A1c MFr Bld: 13.2 % — ABNORMAL HIGH (ref 4.8–5.6)

## 2024-05-01 LAB — RPR: RPR Ser Ql: NONREACTIVE

## 2024-05-01 LAB — HIV ANTIBODY (ROUTINE TESTING W REFLEX): HIV Screen 4th Generation wRfx: NONREACTIVE

## 2024-05-01 LAB — HCV INTERPRETATION

## 2024-05-01 LAB — HEPATITIS B SURFACE ANTIGEN: Hepatitis B Surface Ag: NEGATIVE

## 2024-05-08 LAB — CYTOLOGY - PAP
Chlamydia: NEGATIVE
Comment: NEGATIVE
Comment: NEGATIVE
Comment: NORMAL
Diagnosis: UNDETERMINED — AB
High risk HPV: NEGATIVE
Neisseria Gonorrhea: NEGATIVE

## 2024-05-18 NOTE — Telephone Encounter (Signed)
-----   Message from Nurse [place] S sent at 05/03/2024 12:09 PM EDT ----- Regarding: Appointment Hi,  This patient needs a follow up appointment with Dr. Donetta Furl for preconception counseling. The patient is aware. She will need an Arabic interpreter.  Thank you
